# Patient Record
Sex: Female | Born: 1990 | Race: Black or African American | Hispanic: No | Marital: Single | Smoking: Never smoker
Health system: Southern US, Community
[De-identification: ages and names within clinical notes are randomized; demographics above are authoritative.]

## PROBLEM LIST (undated history)

## (undated) DIAGNOSIS — J302 Other seasonal allergic rhinitis: Secondary | ICD-10-CM

## (undated) DIAGNOSIS — Z8619 Personal history of other infectious and parasitic diseases: Secondary | ICD-10-CM

## (undated) DIAGNOSIS — R109 Unspecified abdominal pain: Secondary | ICD-10-CM

## (undated) DIAGNOSIS — A6000 Herpesviral infection of urogenital system, unspecified: Secondary | ICD-10-CM

## (undated) DIAGNOSIS — D649 Anemia, unspecified: Secondary | ICD-10-CM

## (undated) HISTORY — DX: Other seasonal allergic rhinitis: J30.2

## (undated) HISTORY — DX: Unspecified abdominal pain: R10.9

## (undated) HISTORY — DX: Anemia, unspecified: D64.9

## (undated) HISTORY — DX: Personal history of other infectious and parasitic diseases: Z86.19

## (undated) HISTORY — DX: Herpesviral infection of urogenital system, unspecified: A60.00

---

## 2012-11-02 DIAGNOSIS — A6000 Herpesviral infection of urogenital system, unspecified: Secondary | ICD-10-CM

## 2012-11-02 DIAGNOSIS — R109 Unspecified abdominal pain: Secondary | ICD-10-CM

## 2012-11-02 HISTORY — DX: Herpesviral infection of urogenital system, unspecified: A60.00

## 2012-11-02 HISTORY — DX: Unspecified abdominal pain: R10.9

## 2012-11-21 ENCOUNTER — Ambulatory Visit: Payer: 59

## 2012-11-21 ENCOUNTER — Ambulatory Visit (INDEPENDENT_AMBULATORY_CARE_PROVIDER_SITE_OTHER): Payer: 59 | Admitting: Family Medicine

## 2012-11-21 VITALS — BP 112/72 | HR 110 | Temp 98.9°F | Resp 17 | Ht 63.0 in | Wt 156.0 lb

## 2012-11-21 DIAGNOSIS — R0781 Pleurodynia: Secondary | ICD-10-CM

## 2012-11-21 DIAGNOSIS — R071 Chest pain on breathing: Secondary | ICD-10-CM

## 2012-11-21 DIAGNOSIS — Z113 Encounter for screening for infections with a predominantly sexual mode of transmission: Secondary | ICD-10-CM

## 2012-11-21 DIAGNOSIS — N920 Excessive and frequent menstruation with regular cycle: Secondary | ICD-10-CM

## 2012-11-21 LAB — POCT CBC
Granulocyte percent: 81.2 % — AB (ref 37–80)
HCT, POC: 37.7 % (ref 37.7–47.9)
Hemoglobin: 11.6 g/dL — AB (ref 12.2–16.2)
Lymph, poc: 1.3 (ref 0.6–3.4)
MCH, POC: 22 pg — AB (ref 27–31.2)
MCHC: 30.8 g/dL — AB (ref 31.8–35.4)
MCV: 71.5 fL — AB (ref 80–97)
MID (cbc): 0.5 (ref 0–0.9)
MPV: 11.8 fL (ref 0–99.8)
POC Granulocyte: 7.6 — AB (ref 2–6.9)
POC LYMPH PERCENT: 14 % (ref 10–50)
POC MID %: 4.8 % (ref 0–12)
Platelet Count, POC: 264 10*3/uL (ref 142–424)
RBC: 5.27 M/uL (ref 4.04–5.48)
RDW, POC: 15.1 %
WBC: 9.4 10*3/uL (ref 4.6–10.2)

## 2012-11-21 LAB — RPR

## 2012-11-21 MED ORDER — IBUPROFEN 800 MG PO TABS: 800.0000 mg | ORAL_TABLET | Freq: Three times a day (TID) | ORAL | Status: DC | PRN

## 2012-11-21 NOTE — Progress Notes (Signed)
[facility]:  Office Visit  Chief Complaint:  Chief Complaint  Patient presents with  . Chest Pain    with tightness in chest   . Vaginal Bleeding    patient states it is a lot more then usual     HPI: Alyssa Galvan is a 22 y.o. female who complains of: 1. Right sided Chest tightness x 2 days and also left lower rib pain x 2 days. Tight pain with inhalation also walking. Denies clamminess, numbness,tingling, SOB. Could not get out of bed. She has the right lower rib pain when she is running on treadmill, it is intermittent.   2. Wants STD screening. No prior STD testing. Sexually active. Does not use condom. She had a painful lulcer several weeks ago on her vagina.  3. LMP 11/15/12. Menarche. Flow is normal. Lasts 6-7 days. She has had irregular cycles. Does get clots. She states that today her cycle is heavy and longer than normal and wants to know why that is   History reviewed. No pertinent past medical history. History reviewed. No pertinent past surgical history. History   Social History  . Marital Status: Single    Spouse Name: N/A    Number of Children: N/A  . Years of Education: N/A   Social History Main Topics  . Smoking status: Never Smoker   . Smokeless tobacco: Never Used  . Alcohol Use: No  . Drug Use: No  . Sexually Active: Yes   Other Topics Concern  . None   Social History Narrative  . None   Family History  Problem Relation Age of Onset  . Cancer Paternal Grandmother   . Heart disease Paternal Grandfather    No Known Allergies Prior to Admission medications   Medication Sig Start Date End Date Taking? Authorizing Provider  Dentifrices (BIOTENE DRY MOUTH CARE DT) Place onto teeth.   Yes Historical Provider, MD     ROS: The patient denies chills, night sweats, unintentional weight loss, chest pain, palpitations, wheezing, dyspnea on exertion, nausea, vomiting, abdominal pain, dysuria, hematuria, melena, numbness, weakness, or  tingling.  All other systems have been reviewed and were otherwise negative with the exception of those mentioned in the HPI and as above.    PHYSICAL EXAM: Filed Vitals:   11/21/12 1052  BP: 112/72  Pulse: 110  Temp: 98.9 F (37.2 C)  Resp: 17   Filed Vitals:   11/21/12 1052  Height: 5\' 3"  (1.6 m)  Weight: 156 lb (70.761 kg)   Body mass index is 27.63 kg/(m^2).  General: Alert, no acute distress, slightly anxious HEENT:  Normocephalic, atraumatic, oropharynx patent. EOMI, PERRLa, fundosopic exam gorssly nl Cardiovascular:  Regular rate and rhythm, no rubs murmurs or gallops.  No Carotid bruits, radial pulse intact. No pedal edema.  Respiratory: Clear to auscultation bilaterally.  No wheezes, rales, or rhonchi.  No cyanosis, no use of accessory musculature GI: No organomegaly, abdomen is soft and non-tender, positive bowel sounds.  No masses. Skin: No rashes. Neurologic: Facial musculature symmetric. Anxious repeating questions over again, asking me if she has bladder cancer since her abdomen feels full? Psychiatric: Patient is appropriate throughout our interaction. Lymphatic: No cervical lymphadenopathy Musculoskeletal: Gait intact. GU-no cervical abnormalities. + menses. No masses,lesions, rashes   LABS: No results found for this or any previous visit.   EKG/XRAY:   Primary read interpreted by Dr. Conley Rolls at [facility]. No acute cardiopulmoanry process   ASSESSMENT/PLAN: Encounter Diagnoses  Name Primary?  . Pleuritic  chest pain Yes  . Screening for STD (sexually transmitted disease)   . Heavy menses    Rib and Chest pain secondary to cough and inflammation-chest xray negative Monitor menses to see if cycles is prolonged and heavier than normal, I would expect some of this happens on occasion since her cycles are not consistently regular, they range from 2 weeks-330 days.  ST labs pending Follow-up prn Will get labs to her in the next 2-3 days Ibuprofen 800 mg q8hrs prn,  take with food for rib pain.     LE, THAO PHUONG, DO 11/21/2012 12:12 PM

## 2012-11-22 ENCOUNTER — Other Ambulatory Visit: Payer: Self-pay | Admitting: Family Medicine

## 2012-11-22 ENCOUNTER — Telehealth: Payer: Self-pay

## 2012-11-22 DIAGNOSIS — B009 Herpesviral infection, unspecified: Secondary | ICD-10-CM

## 2012-11-22 DIAGNOSIS — A749 Chlamydial infection, unspecified: Secondary | ICD-10-CM

## 2012-11-22 LAB — GC/CHLAMYDIA PROBE AMP
CT Probe RNA: POSITIVE — AB
GC Probe RNA: NEGATIVE

## 2012-11-22 LAB — HSV(HERPES SIMPLEX VRS) I + II AB-IGG
HSV 1 Glycoprotein G Ab, IgG: <0.1 IV
HSV 2 Glycoprotein G Ab, IgG: 16.25 IV — ABNORMAL HIGH

## 2012-11-22 LAB — HIV ANTIBODY (ROUTINE TESTING W REFLEX): HIV: NONREACTIVE

## 2012-11-22 LAB — HEPATITIS B SURFACE ANTIBODY, QUANTITATIVE: Hep B S AB Quant (Post): 1.2 m[IU]/mL

## 2012-11-22 LAB — HEPATITIS B SURFACE ANTIGEN: Hepatitis B Surface Ag: NEGATIVE

## 2012-11-22 LAB — HEPATITIS C ANTIBODY: HCV Ab: NEGATIVE

## 2012-11-22 MED ORDER — AZITHROMYCIN 250 MG PO TABS: ORAL_TABLET | ORAL | Status: DC

## 2012-11-22 MED ORDER — VALACYCLOVIR HCL 1 G PO TABS: 1000.0000 mg | ORAL_TABLET | Freq: Two times a day (BID) | ORAL | Status: DC

## 2012-11-22 NOTE — Telephone Encounter (Signed)
Left copy of labs up front for pt to p/u

## 2012-11-22 NOTE — Progress Notes (Signed)
Spoke to patient regarding labs. She will come pick them up. 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

## 2012-11-22 NOTE — Telephone Encounter (Signed)
Message copied by Johnnette Litter on Tue Nov 22, 2012  2:59 PM ------      Message from: Lenell Antu      Created: Tue Nov 22, 2012  1:39 PM       Forgot to give you patient name            Tle

## 2012-11-24 ENCOUNTER — Encounter (HOSPITAL_COMMUNITY): Payer: Self-pay | Admitting: Emergency Medicine

## 2012-11-24 ENCOUNTER — Emergency Department (HOSPITAL_COMMUNITY)
Admission: EM | Admit: 2012-11-24 | Discharge: 2012-11-24 | Disposition: A | Discharge status: Home / Self Care | Payer: 59 | Attending: Emergency Medicine | Admitting: Emergency Medicine

## 2012-11-24 DIAGNOSIS — Z79899 Other long term (current) drug therapy: Secondary | ICD-10-CM | POA: Insufficient documentation

## 2012-11-24 DIAGNOSIS — Z3202 Encounter for pregnancy test, result negative: Secondary | ICD-10-CM | POA: Insufficient documentation

## 2012-11-24 DIAGNOSIS — R109 Unspecified abdominal pain: Secondary | ICD-10-CM | POA: Insufficient documentation

## 2012-11-24 LAB — URINALYSIS, ROUTINE W REFLEX MICROSCOPIC
Glucose, UA: NEGATIVE mg/dL
Hgb urine dipstick: NEGATIVE
Protein, ur: NEGATIVE mg/dL
Specific Gravity, Urine: 1.027 (ref 1.005–1.030)

## 2012-11-24 LAB — COMPREHENSIVE METABOLIC PANEL
ALT: 32 U/L (ref 0–35)
AST: 24 U/L (ref 0–37)
CO2: 25 mEq/L (ref 19–32)
Calcium: 8.8 mg/dL (ref 8.4–10.5)
GFR calc non Af Amer: >90 mL/min (ref >90)
Potassium: 3.9 mEq/L (ref 3.5–5.1)
Sodium: 140 mEq/L (ref 135–145)

## 2012-11-24 LAB — CBC
MCH: 22.3 pg — ABNORMAL LOW (ref 26.0–34.0)
Platelets: 237 10*3/uL (ref 150–400)
RBC: 4.43 MIL/uL (ref 3.87–5.11)
WBC: 8.8 10*3/uL (ref 4.0–10.5)

## 2012-11-24 LAB — PREGNANCY, URINE: Preg Test, Ur: NEGATIVE

## 2012-11-24 MED ORDER — GI COCKTAIL ~~LOC~~
30.0000 mL | Freq: Once | ORAL | Status: AC
  Administered 2012-11-24: 30 mL via ORAL
  Filled 2012-11-24: qty 30

## 2012-11-24 MED ORDER — RANITIDINE HCL 150 MG PO CAPS: 150.0000 mg | ORAL_CAPSULE | Freq: Every day | ORAL | Status: DC

## 2012-11-24 MED ORDER — RANITIDINE HCL 150 MG PO TABS: 150.0000 mg | ORAL_TABLET | Freq: Two times a day (BID) | ORAL | Status: DC

## 2012-11-24 NOTE — ED Provider Notes (Signed)
History     CSN: [id number]  Arrival date & time 11/24/12  4098   First MD Initiated Contact with Patient 11/24/12 [phone]      Chief Complaint  Patient presents with  . Abdominal Pain    (Consider location/radiation/quality/duration/timing/severity/associated sxs/prior treatment) HPI Pt presents with c/o epigastric and RUQ pain.  Pt states pain has been almost constant over the past week.  No vomiting, no fever/chills.  Pain is sharp.  No association with eating.  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  Pt states at that time the pain was in left upper abdomen, now it is more central and to the right side.  There are no other associated systemic symptoms, there are no other alleviating or modifying factors.   History reviewed. No pertinent past medical history.  History reviewed. No pertinent past surgical history.  Family History  Problem Relation Age of Onset  . Cancer Paternal Grandmother   . Heart disease Paternal Grandfather     History  Substance Use Topics  . Smoking status: Never Smoker   . Smokeless tobacco: Never Used  . Alcohol Use: No    OB History    Grav Para Term Preterm Abortions TAB SAB Ect Mult Living                  Review of Systems ROS reviewed and all otherwise negative except for mentioned in HPI  Allergies  Review of patient's allergies indicates no known allergies.  Home Medications   Current Outpatient Rx  Name  Route  Sig  Dispense  Refill  . AZITHROMYCIN 250 MG PO TABS      Take all 4 pills PO now         . BIOTENE DRY MOUTH CARE DT   dental   Place 1 application onto teeth daily.          . IBUPROFEN 800 MG PO TABS   Oral   Take 800 mg by mouth every 8 (eight) hours as needed. For pain         . VALACYCLOVIR HCL 1 G PO TABS   Oral   Take 1,000 mg by mouth 2 (two) times daily.         Marland Kitchen RANITIDINE HCL 150 MG PO TABS   Oral   Take 1 tablet (150 mg total) by  mouth 2 (two) times daily.   60 tablet   0     BP 114/64  Pulse 84  Temp 98.6 F (37 C) (Oral)  Resp 16  Ht 5\' 3"  (1.6 m)  Wt 153 lb (69.4 kg)  BMI 27.10 kg/m2  SpO2 98%  LMP 11/21/2012 Vitals reviewed Physical Exam Physical Examination: General appearance - alert, well appearing, and in no distress Mental status - alert, oriented to person, place, and time Eyes- no scleral icterus, no conjunctival injection Mouth - mucous membranes moist, pharynx normal without lesions Chest - clear to auscultation, no wheezes, rales or rhonchi, symmetric air entry Heart - normal rate, regular rhythm, normal S1, S2, no murmurs, rubs, clicks or gallops Abdomen - soft, epigastric and RUQ ttp, no gaurding or rebound, nondistended, no masses or organomegaly, nabs Extremities - peripheral pulses normal, no pedal edema, no clubbing or cyanosis Skin - normal coloration and turgor, no rashes  ED Course  Procedures (including critical care time)  Labs Reviewed  CBC - Abnormal; Notable for the following:    Hemoglobin 9.9 (*)  HCT 29.5 (*)     MCV 66.6 (*)     MCH 22.3 (*)     All other components within normal limits  COMPREHENSIVE METABOLIC PANEL - Abnormal; Notable for the following:    Albumin 3.3 (*)     Total Bilirubin 0.2 (*)     All other components within normal limits  LIPASE, BLOOD - Abnormal; Notable for the following:    Lipase 8 (*)     All other components within normal limits  URINALYSIS, ROUTINE W REFLEX MICROSCOPIC  PREGNANCY, URINE   No results found.   1. Abdominal pain       MDM  Pt presenting with c/o epigastric and right upper quadrant pain.  Labs reassuring.  Pt is overall nontoxic and well hydrated in appearance.  Mild ttp in epigastric and ruq.  Advised patient and mother at the bedside that next step if pain continues would be outpatient ultrasound.  Will give trial of zantac.  Discharged with strict return precautions.  Pt agreeable with  plan.        Ethelda Chick, MD 11/24/12 1039

## 2012-11-24 NOTE — ED Notes (Signed)
Pt states that over the last week she has been having epigastric pain, denies n/v or diarrhea.  States the pain is sharp in nature.  States that nothing has relieved the pain.  Denies fever or chills.  States that she was seen 11/22/2012 at a Urgent care and was prescribed Motrin but she has not had it filled yet.

## 2012-12-08 ENCOUNTER — Emergency Department (HOSPITAL_COMMUNITY)
Admission: EM | Admit: 2012-12-08 | Discharge: 2012-12-08 | Disposition: A | Discharge status: Home / Self Care | Payer: 59 | Attending: Emergency Medicine | Admitting: Emergency Medicine

## 2012-12-08 ENCOUNTER — Encounter (HOSPITAL_COMMUNITY): Payer: Self-pay | Admitting: Adult Health

## 2012-12-08 ENCOUNTER — Emergency Department (HOSPITAL_COMMUNITY): Payer: 59

## 2012-12-08 DIAGNOSIS — R109 Unspecified abdominal pain: Secondary | ICD-10-CM

## 2012-12-08 DIAGNOSIS — R112 Nausea with vomiting, unspecified: Secondary | ICD-10-CM | POA: Insufficient documentation

## 2012-12-08 DIAGNOSIS — Z3202 Encounter for pregnancy test, result negative: Secondary | ICD-10-CM | POA: Insufficient documentation

## 2012-12-08 DIAGNOSIS — R1031 Right lower quadrant pain: Secondary | ICD-10-CM

## 2012-12-08 DIAGNOSIS — Z8719 Personal history of other diseases of the digestive system: Secondary | ICD-10-CM | POA: Insufficient documentation

## 2012-12-08 DIAGNOSIS — K5900 Constipation, unspecified: Secondary | ICD-10-CM | POA: Insufficient documentation

## 2012-12-08 LAB — CBC WITH DIFFERENTIAL/PLATELET
Basophils Absolute: 0 10*3/uL (ref 0.0–0.1)
Lymphs Abs: 2 10*3/uL (ref 0.7–4.0)
MCH: 22.3 pg — ABNORMAL LOW (ref 26.0–34.0)
MCV: 66.8 fL — ABNORMAL LOW (ref 78.0–100.0)
Monocytes Absolute: 0.5 10*3/uL (ref 0.1–1.0)
Platelets: 319 10*3/uL (ref 150–400)
RDW: 14.8 % (ref 11.5–15.5)

## 2012-12-08 LAB — COMPREHENSIVE METABOLIC PANEL
AST: 21 U/L (ref 0–37)
Albumin: 4 g/dL (ref 3.5–5.2)
BUN: 10 mg/dL (ref 6–23)
CO2: 25 mEq/L (ref 19–32)
Calcium: 9.4 mg/dL (ref 8.4–10.5)
Creatinine, Ser: 0.84 mg/dL (ref 0.50–1.10)
GFR calc non Af Amer: >90 mL/min (ref >90)

## 2012-12-08 LAB — URINALYSIS, MICROSCOPIC ONLY
Leukocytes, UA: NEGATIVE
Nitrite: NEGATIVE
Specific Gravity, Urine: 1.028 (ref 1.005–1.030)
pH: 5 (ref 5.0–8.0)

## 2012-12-08 LAB — LIPASE, BLOOD: Lipase: 12 U/L (ref 11–59)

## 2012-12-08 LAB — POCT PREGNANCY, URINE: Preg Test, Ur: NEGATIVE

## 2012-12-08 MED ORDER — ONDANSETRON 4 MG PO TBDP
4.0000 mg | ORAL_TABLET | Freq: Once | ORAL | Status: AC
  Administered 2012-12-08: 4 mg via ORAL

## 2012-12-08 MED ORDER — POLYETHYLENE GLYCOL 3350 17 GM/SCOOP PO POWD: 17.0000 g | Freq: Two times a day (BID) | ORAL | Status: DC

## 2012-12-08 MED ORDER — IOHEXOL 300 MG/ML  SOLN
100.0000 mL | Freq: Once | INTRAMUSCULAR | Status: AC | PRN
  Administered 2012-12-08: 100 mL via INTRAVENOUS

## 2012-12-08 MED ORDER — DOCUSATE SODIUM 100 MG PO CAPS: 100.0000 mg | ORAL_CAPSULE | Freq: Two times a day (BID) | ORAL | Status: DC

## 2012-12-08 MED ORDER — ONDANSETRON 4 MG PO TBDP
8.0000 mg | ORAL_TABLET | Freq: Once | ORAL | Status: AC
  Administered 2012-12-08: 8 mg via ORAL
  Filled 2012-12-08: qty 2

## 2012-12-08 MED ORDER — ONDANSETRON 4 MG PO TBDP
ORAL_TABLET | ORAL | Status: AC
  Filled 2012-12-08: qty 2

## 2012-12-08 MED ORDER — IOHEXOL 300 MG/ML  SOLN
50.0000 mL | Freq: Once | INTRAMUSCULAR | Status: AC | PRN
  Administered 2012-12-08: 50 mL via ORAL

## 2012-12-08 NOTE — ED Notes (Signed)
Pt vomited x1, pain and nausea less after this

## 2012-12-08 NOTE — ED Notes (Signed)
Pt dc to home.  Pt states understanding to dc paperwork.  Pt ambulatory to exit.  Pt denies need for w/c.

## 2012-12-08 NOTE — ED Provider Notes (Signed)
History     CSN: [id number]  Arrival date & time 12/08/12  0052   First MD Initiated Contact with Patient 12/08/12 0158      Chief Complaint  Patient presents with  . Abdominal Pain    (Consider location/radiation/quality/duration/timing/severity/associated sxs/prior treatment) HPI Lauryl Bouyer is a 22 y.o. female who presents with generalized abdominal pain. This is localized to periumbilical region, she says it started this evening and is worsened over the course the last few hours. Pain started and then she's also had some nausea. She has not vomited prior to the interview, during medical interview patient vomits. She says the pain does radiate to her back she denies any diarrhea denies any hematochezia or melena. Last period was January. Patient was seen January 23 for similar periumbilical pain patient had a negative workup at that time. Patient denies any fevers but did have some chills earlier today. She has no vaginal discharge, no dysuria, no frequency. No flank pain.  History reviewed. No pertinent past medical history.  History reviewed. No pertinent past surgical history.  Family History  Problem Relation Age of Onset  . Cancer Paternal Grandmother   . Heart disease Paternal Grandfather     History  Substance Use Topics  . Smoking status: Never Smoker   . Smokeless tobacco: Never Used  . Alcohol Use: No    OB History    Grav Para Term Preterm Abortions TAB SAB Ect Mult Living                  Review of Systems At least 10pt or greater review of systems completed and are negative except where specified in the HPI.  Allergies  Review of patient's allergies indicates no known allergies.  Home Medications   Current Outpatient Rx  Name  Route  Sig  Dispense  Refill  . BIOTIN PO   Oral   Take 1 tablet by mouth daily.           BP 127/74  Pulse 73  Temp 97.6 F (36.4 C) (Oral)  Resp 18  SpO2 100%  LMP 11/21/2012  Physical Exam  Nursing notes  reviewed.  Electronic medical record reviewed. VITAL SIGNS:   Filed Vitals:   12/08/12 0056  BP: 127/74  Pulse: 73  Temp: 97.6 F (36.4 C)  TempSrc: Oral  Resp: 18  SpO2: 100%   CONSTITUTIONAL: Awake, oriented, appears non-toxic HENT: Atraumatic, normocephalic, oral mucosa pink and moist, airway patent. Nares patent without drainage. External ears normal. EYES: Conjunctiva clear, EOMI, PERRLA NECK: Trachea midline, non-tender, supple CARDIOVASCULAR: Normal heart rate, Normal rhythm, No murmurs, rubs, gallops PULMONARY/CHEST: Clear to auscultation, no rhonchi, wheezes, or rales. Symmetrical breath sounds. Non-tender. ABDOMINAL: Non-distended, soft, tenderness to palpation in right lower quadrant that is worse than periumbilical palpation.  BS normal. NEUROLOGIC: Non-focal, moving all four extremities, no gross sensory or motor deficits. EXTREMITIES: No clubbing, cyanosis, or edema SKIN: Warm, Dry, No erythema, No rash  ED Course  Procedures (including critical care time)  Labs Reviewed  URINALYSIS, MICROSCOPIC ONLY - Abnormal; Notable for the following:    APPearance CLOUDY (*)     All other components within normal limits  CBC WITH DIFFERENTIAL - Abnormal; Notable for the following:    Hemoglobin 11.0 (*)     HCT 33.0 (*)     MCV 66.8 (*)     MCH 22.3 (*)     All other components within normal limits  COMPREHENSIVE METABOLIC PANEL - Abnormal; Notable for  the following:    Glucose, Bld 104 (*)     Total Bilirubin 0.2 (*)     All other components within normal limits  LIPASE, BLOOD  POCT PREGNANCY, URINE   Ct Abdomen Pelvis W Contrast  12/08/2012  *RADIOLOGY REPORT*  Clinical Data: Right lower quadrant pain for 1 week.  Vomiting.  CT ABDOMEN AND PELVIS WITH CONTRAST  Technique:  Multidetector CT imaging of the abdomen and pelvis was performed following the standard protocol during bolus administration of intravenous contrast.  Contrast: OMNIPAQUE IOHEXOL 300 MG/ML  SOLN   Comparison: None.  Findings: The lung bases are clear.  The liver, spleen, gallbladder, pancreas, adrenal glands, kidneys, abdominal aorta, and retroperitoneal lymph nodes are unremarkable. Stool filled colon without significant distension.  Stomach and small bowel are decompressed.  No free fluid or free air in the abdomen.  Pelvis:  The appendix is visualized and appears normal with gash to the tip.  The terminal ileum is mildly distended without wall thickening, probably normal variation.  Diffusely stool filled colon may reflect constipation.  No evidence of diverticulitis. The uterus and ovaries are not enlarged.  No free pelvic fluid collections.  No loculated fluid collections.  No bladder wall thickening.  No significant pelvic lymphadenopathy.  Normal alignment of the lumbar vertebrae.  IMPRESSION: Normal appendix.  Stool filled colon suggesting constipation.  No definite acute inflammatory changes in the abdomen or pelvis.   Original Report Authenticated By: Burman Nieves, M.D.      1. Abdominal  pain, other specified site   2. Right lower quadrant pain   3. Nausea and vomiting   4. Constipation      Medications  BIOTIN PO (not administered)  polyethylene glycol powder (GLYCOLAX) powder (not administered)  docusate sodium (COLACE) 100 MG capsule (not administered)  ondansetron (ZOFRAN-ODT) disintegrating tablet 8 mg (8 mg Oral Given 12/08/12 0112)  ondansetron (ZOFRAN-ODT) disintegrating tablet 4 mg (4 mg Oral Given 12/08/12 0214)  iohexol (OMNIPAQUE) 300 MG/ML solution 50 mL (50 mL Oral Contrast Given 12/08/12 0240)  iohexol (OMNIPAQUE) 300 MG/ML solution 100 mL (100 mL Intravenous Contrast Given 12/08/12 0338)     MDM  Asusena Wagler is a 22 y.o. female presents with abdominal pain nausea and vomiting. No diarrhea. Do not think this is acute gastroenteritis-patient was prescribed acid reducing medicine but sauces she had GERD previously, her presentation currently is not consistent with  GERD-I. most concerned for appendicitis at this time.  Discussed risks and benefits of abdominal and pelvis CT with patient she understands accepts the risks.  Labs are fairly unremarkable this time, not going to rely on WBC count as it's very nonpecific/insensitive.  CT of the abdomen and pelvis shows a normal appendix is visualized well, radiologist makes comment that there is a stool filled colon suggesting constipation, there is no definite acute inflammatory changes in the abdomen or pelvis.  Putting patient on bowel regimen to include Colace and GlycoLax.  I explained the diagnosis and have given explicit precautions to return to the ER including any other new or worsening symptoms. The patient understands and accepts the medical plan as it's been dictated and I have answered their questions. Discharge instructions concerning home care and prescriptions have been given.  The patient is STABLE and is discharged to home in good condition.          Jones Skene, MD 12/08/12 0830

## 2012-12-08 NOTE — ED Notes (Signed)
Presents with 2 hours of abdominal pain that hurts all over and has increased in intensity. Pain is described as intermittent and "fluid like" pain radiates to back. Associated with nausea, denies diarrhea. LMP: in January. Nothing makes pain better, lying down makes pain worse.

## 2012-12-08 NOTE — ED Notes (Signed)
Pt to ct via stretcher

## 2012-12-21 ENCOUNTER — Ambulatory Visit: Payer: 59 | Admitting: Medical

## 2012-12-23 ENCOUNTER — Ambulatory Visit: Payer: 59 | Admitting: Medical

## 2013-03-02 DIAGNOSIS — Z8619 Personal history of other infectious and parasitic diseases: Secondary | ICD-10-CM

## 2013-03-02 HISTORY — DX: Personal history of other infectious and parasitic diseases: Z86.19

## 2013-03-23 ENCOUNTER — Ambulatory Visit (INDEPENDENT_AMBULATORY_CARE_PROVIDER_SITE_OTHER): Payer: 59 | Admitting: Family Medicine

## 2013-03-23 VITALS — BP 102/62 | HR 71 | Temp 98.9°F | Resp 16 | Ht 63.0 in | Wt 148.0 lb

## 2013-03-23 DIAGNOSIS — J020 Streptococcal pharyngitis: Secondary | ICD-10-CM

## 2013-03-23 DIAGNOSIS — R21 Rash and other nonspecific skin eruption: Secondary | ICD-10-CM

## 2013-03-23 DIAGNOSIS — J029 Acute pharyngitis, unspecified: Secondary | ICD-10-CM

## 2013-03-23 DIAGNOSIS — Z7251 High risk heterosexual behavior: Secondary | ICD-10-CM

## 2013-03-23 DIAGNOSIS — K0510 Chronic gingivitis, plaque induced: Secondary | ICD-10-CM

## 2013-03-23 LAB — POCT CBC
Granulocyte percent: 69.9 %G (ref 37–80)
HCT, POC: 37.9 % (ref 37.7–47.9)
Hemoglobin: 11.9 g/dL — AB (ref 12.2–16.2)
Lymph, poc: 1.6 (ref 0.6–3.4)
MCH, POC: 22.5 pg — AB (ref 27–31.2)
MCHC: 31.4 g/dL — AB (ref 31.8–35.4)
MCV: 71.5 fL — AB (ref 80–97)
MID (cbc): 0.4 (ref 0–0.9)
MPV: 11.5 fL (ref 0–99.8)
POC Granulocyte: 4.8 (ref 2–6.9)
POC LYMPH PERCENT: 24 %L (ref 10–50)
POC MID %: 6.1 %M (ref 0–12)
Platelet Count, POC: 215 10*3/uL (ref 142–424)
RBC: 5.3 M/uL (ref 4.04–5.48)
RDW, POC: 15.4 %
WBC: 6.8 10*3/uL (ref 4.6–10.2)

## 2013-03-23 LAB — POCT RAPID STREP A (OFFICE): Rapid Strep A Screen: NEGATIVE

## 2013-03-23 MED ORDER — PREDNISONE 20 MG PO TABS: ORAL_TABLET | ORAL | Status: DC

## 2013-03-23 MED ORDER — PENICILLIN V POTASSIUM 500 MG PO TABS: 500.0000 mg | ORAL_TABLET | Freq: Three times a day (TID) | ORAL | Status: DC

## 2013-03-23 MED ORDER — IVERMECTIN 3 MG PO TABS: 3.0000 mg | ORAL_TABLET | Freq: Once | ORAL | Status: DC

## 2013-03-23 NOTE — Patient Instructions (Addendum)
Please start one over the counter iron tablet daily and return in a month to recheck the blood count because you have a mild anemia Other tests are pending.  Strep is negative.  Gingivitis Gingivitis is a form of gum (periodontal) disease that causes redness, soreness, and swelling (inflammation) of your gums. CAUSES The most common cause of gingivitis is poor oral hygiene. A sticky substance made of bacteria, mucus, and food particles (plaque), is deposited on the exposed part of teeth. As plaque builds up, it reacts with the saliva in your mouth to form something called  tartar. Tartar is a hard deposit that becomes trapped around the base of the tooth. Plaque and tartar irritate the gums, leading to the formation of gingivitis. Other factors that increase your risk for gingivitis include:   Tobacco use.  Diabetes.  Older age.  Certain medications.  Certain viral or fungal infections.  Dry mouth.  Hormonal changes such as during pregnancy.  Poor nutrition.  Substance abuse.  Poor fitting dental restorations or appliances. SYMPTOMS You may notice inflammation of the soft tissue (gingiva) around the teeth. When these tissues become inflamed, they bleed easily, especially during flossing or brushing. The gums may also be:   Tender to the touch.  Bright red, purple red, or have a shiny appearance.  Swollen.  Wearing away from the teeth (receding), which exposes more of the tooth. Bad breath is often present. Continued infection around teeth can eventually cause cavities and loosen teeth. This may lead to eventual tooth loss. DIAGNOSIS A medical and dental history will be taken. Your mouth, teeth, and gums will be examined. Your dentist will look for soft, swollen purple-red, irritated gums. There may be deposits of plaque and tartar at the base of the teeth. Your gums will be looked at for the degree of redness, puffiness, and bleeding tendencies. Your dentist will see if any of  the teeth are loose. X-rays may be taken to see if the inflammation has spread to the supporting structures of the teeth. TREATMENT The goal is to reduce and reverse the inflammation. Proper treatment can usually reverse the symptoms of gingivitis and prevent further progression of the disease. Have your teeth cleaned. During the cleaning, all plaque and tartar will be removed. Instruction for proper home care will be given. You will need regular professional cleanings and check-ups in the future. HOME CARE INSTRUCTIONS  Brush your teeth twice a day and floss at least once per day. When flossing, it is best to floss first then brush.  Limit sugar between meals and maintain a well-balanced diet.  Even the best dental hygiene will not prevent plaque from developing. It is necessary for you to see your dentist on a regular basis for cleaning and regular checkups.  Your dentist can recommend proper oral hygiene and mouth care and suggest special toothpastes or mouth rinses.  Stop smoking. SEEK DENTAL OR MEDICAL CARE IF:  You have painful, reddened tissue around your teeth, or you have puffy swollen gums.  You have difficulty chewing.  You notice any loose or infected teeth.  You have swollen glands.  Your gums bleed easily when you brush your teeth or are very tender to the touch. Document Released: 04/14/2001 Document Revised: 01/11/2012 Document Reviewed: 01/23/2011 [facility] Patient Information 2014 [place], [place].    Sexually Transmitted Disease Sexually transmitted disease (STD) refers to any infection that is passed from person to person during sexual activity. This may happen by way of saliva, semen, blood, vaginal  mucus, or urine. Common STDs include:  Gonorrhea.  Chlamydia.  Syphilis.  HIV/AIDS.  Genital herpes.  Hepatitis B and C.  Trichomonas.  Human papillomavirus (HPV).  Pubic lice. CAUSES  An STD may be spread by bacteria, virus, or parasite. A person can  get an STD by:  Sexual intercourse with an infected person.  Sharing sex toys with an infected person.  Sharing needles with an infected person.  Having intimate contact with the genitals, mouth, or rectal areas of an infected person. SYMPTOMS  Some people may not have any symptoms, but they can still pass the infection to others. Different STDs have different symptoms. Symptoms include:  Painful or bloody urination.  Pain in the pelvis, abdomen, vagina, anus, throat, or eyes.  Skin rash, itching, irritation, growths, or sores (lesions). These usually occur in the genital or anal area.  Abnormal vaginal discharge.  Penile discharge in men.  Soft, flesh-colored skin growths in the genital or anal area.  Fever.  Pain or bleeding during sexual intercourse.  Swollen glands in the groin area.  Yellow skin and eyes (jaundice). This is seen with hepatitis. DIAGNOSIS  To make a diagnosis, your caregiver may:  Take a medical history.  Perform a physical exam.  Take a specimen (culture) to be examined.  Examine a sample of discharge under a microscope.  Perform blood tests.  Perform a Pap test, if this applies.  Perform a colposcopy.  Perform a laparoscopy. TREATMENT   Chlamydia, gonorrhea, trichomonas, and syphilis can be cured with antibiotic medicine.  Genital herpes, hepatitis, and HIV can be treated, but not cured, with prescribed medicines. The medicines will lessen the symptoms.  Genital warts from HPV can be treated with medicine or by freezing, burning (electrocautery), or surgery. Warts may come back.  HPV is a virus and cannot be cured with medicine or surgery.However, abnormal areas may be followed very closely by your caregiver and may be removed from the cervix, vagina, or vulva through office procedures or surgery. If your diagnosis is confirmed, your recent sexual partners need treatment. This is true even if they are symptom-free or have a negative  culture or evaluation. They should not have sex until their caregiver says it is okay. HOME CARE INSTRUCTIONS  All sexual partners should be informed, tested, and treated for all STDs.  Take your antibiotics as directed. Finish them even if you start to feel better.  Only take over-the-counter or prescription medicines for pain, discomfort, or fever as directed by your caregiver.  Rest.  Eat a balanced diet and drink enough fluids to keep your urine clear or pale yellow.  Do not have sex until treatment is completed and you have followed up with your caregiver. STDs should be checked after treatment.  Keep all follow-up appointments, Pap tests, and blood tests as directed by your caregiver.  Only use latex condoms and water-soluble lubricants during sexual activity. Do not use petroleum jelly or oils.  Avoid alcohol and illegal drugs.  Get vaccinated for HPV and hepatitis. If you have not received these vaccines in the past, talk to your caregiver about whether one or both might be right for you.  Avoid risky sex practices that can break the skin. The only way to avoid getting an STD is to avoid all sexual activity.Latex condoms and dental dams (for oral sex) will help lessen the risk of getting an STD, but will not completely eliminate the risk. SEEK MEDICAL CARE IF:   You have a fever.  You have any new or worsening symptoms. Document Released: 01/09/2003 Document Revised: 01/11/2012 Document Reviewed: 01/16/2011 [facility] Patient Information 2014 [place], [place].

## 2013-03-23 NOTE — Progress Notes (Signed)
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  She also wants STD testing (h/o unprotected IC and h/o HSV 2), and has 1 day of sore throat Boyfriend is now itching.  She was diagnosed with scabies. LMP:  now  Objective:  NAD Scaling feet, no rash on hands Throat: red, withoutexudates Neck: supple, no adenopathy TM's:  Normal Results for orders placed in visit on 03/23/13  POCT RAPID STREP A (OFFICE)      Result Value Range   Rapid Strep A Screen Negative  Negative  POCT CBC      Result Value Range   WBC 6.8  4.6 - 10.2 K/uL   Lymph, poc 1.6  0.6 - 3.4   POC LYMPH PERCENT 24.0  10 - 50 %L   MID (cbc) 0.4  0 - 0.9   POC MID % 6.1  0 - 12 %M   POC Granulocyte 4.8  2 - 6.9   Granulocyte percent 69.9  37 - 80 %G   RBC 5.30  4.04 - 5.48 M/uL   Hemoglobin 11.9 (*) 12.2 - 16.2 g/dL   HCT, POC 16.1  09.6 - 47.9 %   MCV 71.5 (*) 80 - 97 fL   MCH, POC 22.5 (*) 27 - 31.2 pg   MCHC 31.4 (*) 31.8 - 35.4 g/dL   RDW, POC 04.5     Platelet Count, POC 215  142 - 424 K/uL   MPV 11.5  0 - 99.8 fL      Assessment:  Gingivitis, atypical rash is not characteristic of any other than eczema, and unprotected sex  Plan:  Recommend condoms Daily iron with follow up in a month Sore throat - Plan: POCT rapid strep A, Culture, Group A Strep, penicillin v potassium (VEETID) 500 MG tablet  Unprotected sex - Plan: RPR, HIV antibody, GC/chlamydia probe amp, urine  Rash and nonspecific skin eruption - Plan: POCT CBC, predniSONE (DELTASONE) 20 MG tablet, ivermectin (STROMECTOL) 3 MG TABS  Gingivitis - Plan: penicillin v potassium (VEETID) 500 MG tablet  Signed, Mosie Epstein, MD

## 2013-03-24 LAB — GC/CHLAMYDIA PROBE AMP, URINE
Chlamydia, Swab/Urine, PCR: POSITIVE — AB
GC Probe Amp, Urine: NEGATIVE

## 2013-03-24 LAB — HIV ANTIBODY (ROUTINE TESTING W REFLEX): HIV: NONREACTIVE

## 2013-03-24 LAB — RPR

## 2013-03-25 LAB — CULTURE, GROUP A STREP: Organism ID, Bacteria: NORMAL

## 2013-03-25 MED ORDER — AZITHROMYCIN 250 MG PO TABS: ORAL_TABLET | ORAL | Status: DC

## 2013-03-25 NOTE — Addendum Note (Signed)
Addended by: Johnnette Litter on: 03/25/2013 12:04 PM   Modules accepted: Orders

## 2013-04-19 ENCOUNTER — Ambulatory Visit (INDEPENDENT_AMBULATORY_CARE_PROVIDER_SITE_OTHER): Payer: 59 | Admitting: Medical

## 2013-04-19 ENCOUNTER — Telehealth: Payer: Self-pay

## 2013-04-19 ENCOUNTER — Encounter: Payer: Self-pay | Admitting: Medical

## 2013-04-19 VITALS — BP 100/60 | HR 68 | Temp 98.1°F | Resp 16 | Ht 62.0 in | Wt 152.0 lb

## 2013-04-19 DIAGNOSIS — K0510 Chronic gingivitis, plaque induced: Secondary | ICD-10-CM

## 2013-04-19 DIAGNOSIS — L309 Dermatitis, unspecified: Secondary | ICD-10-CM

## 2013-04-19 DIAGNOSIS — Z8619 Personal history of other infectious and parasitic diseases: Secondary | ICD-10-CM

## 2013-04-19 DIAGNOSIS — L259 Unspecified contact dermatitis, unspecified cause: Secondary | ICD-10-CM

## 2013-04-19 DIAGNOSIS — A6000 Herpesviral infection of urogenital system, unspecified: Secondary | ICD-10-CM

## 2013-04-19 DIAGNOSIS — Z0000 Encounter for general adult medical examination without abnormal findings: Secondary | ICD-10-CM

## 2013-04-19 MED ORDER — AZITHROMYCIN 500 MG PO TABS: 1000.0000 mg | ORAL_TABLET | Freq: Every day | ORAL | Status: DC

## 2013-04-19 NOTE — Progress Notes (Signed)
Subjective:   HPI  Alyssa Galvan is a 22 y.o. female who presents for a complete physical.  New patient today.  Was seeing urgent care for acute issues.   Preventative care: Last ophthalmology visit:n/a Last dental visit:n/a Last colonoscopy:n/a Last mammogram:n/a Last gynecological exam:2014 Last EKGn/a Last labs:03/2013  Prior vaccinations: TD or Tdap:unsure  Influenza:n/a Pneumococcal:n/a Shingles/Zostavax:n/a Other:   Advanced directive:n/a Health care power of attorney:n/a Living will:n/a  Concerns: Saw urgent care this year twice, once for new diagnosis of herpes, once for chlamydia, was treated for both, and has since broke up with that sexual partner.  No current vaginal symptoms.  Currently abstinent.    Has concerns about skin rash.  Was thought to have scabies few months back, was given permethrin.  Now has patch of rash on arms and nec, mild.   Reviewed their medical, surgical, family, social, medication, and allergy history and updated chart as appropriate.   Past Medical History  Diagnosis Date  . Seasonal allergic rhinitis   . Anemia     longstanding, mild per patient  . Genital herpes 2014  . History of chlamydia 03/2013  . Abdominal pain 2014    ED visit, etiology constipation    History reviewed. No pertinent past surgical history.  Family History  Problem Relation Age of Onset  . Cancer Paternal Grandmother   . Heart disease Paternal Grandfather     History   Social History  . Marital Status: Single    Spouse Name: N/A    Number of Children: N/A  . Years of Education: N/A   Occupational History  . Not on file.   Social History Main Topics  . Smoking status: Never Smoker   . Smokeless tobacco: Never Used  . Alcohol Use: No  . Drug Use: No  . Sexually Active: Not Currently    Birth Control/ Protection: None   Other Topics Concern  . 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   No current facility-administered medications on file prior to visit.    No Known Allergies   Review of Systems Constitutional: -fever, -chills, -sweats, -unexpected weight change, -decreased appetite, -fatigue Allergy: -sneezing, -itching, -congestion Dermatology: -changing moles, -+rash, -lumps ENT: -runny nose, -ear pain, -sore throat, -hoarseness, -sinus pain, -teeth pain, - ringing in ears, -hearing loss, -nosebleeds Cardiology: -chest pain, -palpitations, -swelling, -difficulty breathing when lying flat, -waking up short of breath Respiratory: -cough, -shortness of breath, -difficulty breathing with exercise or exertion, -wheezing, -coughing up blood Gastroenterology: -abdominal pain, -nausea, -vomiting, -diarrhea, -constipation, +blood in stool, -changes in bowel movement, -difficulty swallowing or eating Hematology: -bleeding, -bruising  Musculoskeletal: -joint aches, -muscle aches, -joint swelling, -back pain, -neck pain, -cramping, -changes in gait Ophthalmology: denies vision changes, eye redness, itching, discharge Urology: -burning with urination, -difficulty urinating, -blood in urine, -urinary frequency, -urgency, -incontinence Neurology: -headache, -weakness, -tingling, -numbness, -memory loss, -falls, -dizziness Psychology: -depressed mood, -agitation, -sleep problems     Objective:   Physical Exam  Filed Vitals:   04/19/13 1451  BP: 100/60  Pulse: 68  Temp: 98.1 F (36.7 C)  Resp: 16    General appearance: alert, no distress, WD/WN, AA female Skin: faint patch of rough skin left antecubital region, otherwise no rash HEENT: normocephalic, conjunctiva/corneas normal, sclerae anicteric, PERRLA, EOMi, nares patent, no discharge or erythema, pharynx normal Oral  cavity: MMM, tongue normal, teeth normal Neck: supple, no lymphadenopathy, no thyromegaly, no masses,  normal ROM Chest: non tender, normal shape and expansion Heart: RRR, normal S1, S2, no murmurs Lungs: CTA bilaterally, no wheezes, rhonchi, or rales Abdomen: +bs, soft, non tender, non distended, no masses, no hepatomegaly, no splenomegaly, no bruits Back: non tender, normal ROM, no scoliosis Musculoskeletal: upper extremities non tender, no obvious deformity, normal ROM throughout, lower extremities non tender, no obvious deformity, normal ROM throughout Extremities: no edema, no cyanosis, no clubbing Pulses: 2+ symmetric, upper and lower extremities, normal cap refill Neurological: alert, oriented x 3, CN2-12 intact, strength normal upper extremities and lower extremities, sensation normal throughout, DTRs 2+ throughout, no cerebellar signs, gait normal Psychiatric: normal affect, behavior normal, pleasant  Breast/gyn/rectal - deferred   Assessment and Plan :    Encounter Diagnoses  Name Primary?  . Routine general medical examination at a health care facility Yes  . Eczema   . Genital herpes   . History of chlamydia   . Gingivitis       Physical exam - discussed healthy lifestyle, diet, exercise, preventative care, vaccinations, and addressed their concerns.  Handout given.  discussed contraception.  She is practicing abstinence for now.  Eczema - discussed diagnosis, daily moisturizing lotion, preventative measures, OTC hydrocortisone for flare ups, and recheck if worsening.  Genital herpes - discussed transmission, prevention, acute vs suppressive therapy, condom use.  history of chlamydia, 03/2013.  discussed prevention, safe sex, and she will return in 60mo for repeat testing  Gingivitis - f/u with dentist, discussed flossing, brushing, hygiene  Follow-up 60mo

## 2013-04-19 NOTE — Telephone Encounter (Signed)
Is it okay to send in the Zithromax? Again?

## 2013-04-19 NOTE — Telephone Encounter (Signed)
Pt states that she tested positive for chlamydia in may and never picked up the rx that was precribed for it because she had to go out of town, pt would like to know if the medication prescribed could be called in again she does not know the name of the medication. Best# [phone]  Pharmacy:[facility]

## 2013-04-19 NOTE — Patient Instructions (Addendum)
Dr. Yancey Flemings, dentist [address] [place], [place] [address] [phone] [email or web address]  Return in 3 months for STD recheck.  Begin Hydrocortisone cream OTC for eczema. Use daily moisturizing lotion to your skin.   Eczema Atopic dermatitis, or eczema, is an inherited type of sensitive skin. Often people with eczema have a family history of allergies, asthma, or hay fever. It causes a red itchy rash and dry scaly skin. The itchiness may occur before the skin rash and may be very intense. It is not contagious. Eczema is generally worse during the cooler winter months and often improves with the warmth of summer. Eczema usually starts showing signs in infancy. Some children outgrow eczema, but it may last through adulthood. Flare-ups may be caused by:  Eating something or contact with something you are sensitive or allergic to.  Stress. DIAGNOSIS  The diagnosis of eczema is usually based upon symptoms and medical history. TREATMENT  Eczema cannot be cured, but symptoms usually can be controlled with treatment or avoidance of allergens (things to which you are sensitive or allergic to).  Controlling the itching and scratching.  Use over-the-counter antihistamines as directed for itching. It is especially useful at night when the itching tends to be worse.  Use over-the-counter steroid creams as directed for itching.  Scratching makes the rash and itching worse and may cause impetigo (a skin infection) if fingernails are contaminated (dirty).  Keeping the skin well moisturized with creams every day. This will seal in moisture and help prevent dryness. Lotions containing alcohol and water can dry the skin and are not recommended.  Limiting exposure to allergens.  Recognizing situations that cause stress.  Developing a plan to manage stress. HOME CARE INSTRUCTIONS   Take prescription and over-the-counter medicines as directed by your caregiver.  Do not use anything on the  skin without checking with your caregiver.  Keep baths or showers short (5 minutes) in warm (not hot) water. Use mild cleansers for bathing. You may add non-perfumed bath oil to the bath water. It is best to avoid soap and bubble bath.  Immediately after a bath or shower, when the skin is still damp, apply a moisturizing ointment to the entire body. This ointment should be a petroleum ointment. This will seal in moisture and help prevent dryness. The thicker the ointment the better. These should be unscented.  Keep fingernails cut short and wash hands often. If your child has eczema, it may be necessary to put soft gloves or mittens on your child at night.  Dress in clothes made of cotton or cotton blends. Dress lightly, as heat increases itching.  Avoid foods that may cause flare-ups. Common foods include cow's milk, peanut butter, eggs and wheat.  Keep a child with eczema away from anyone with fever blisters. The virus that causes fever blisters (herpes simplex) can cause a serious skin infection in children with eczema. SEEK MEDICAL CARE IF:   Itching interferes with sleep.  The rash gets worse or is not better within one week following treatment.  The rash looks infected (pus or soft yellow scabs).  You or your child has an oral temperature above 102 F (38.9 C).  Your baby is older than 3 months with a rectal temperature of 100.5 F (38.1 C) or higher for more than 1 day.  The rash flares up after contact with someone who has fever blisters. SEEK IMMEDIATE MEDICAL CARE IF:   Your baby is older than 3 months with a rectal temperature of  102 F (38.9 C) or higher.  Your baby is older than 3 months or younger with a rectal temperature of 100.4 F (38 C) or higher. Document Released: 10/16/2000 Document Revised: 01/11/2012 Document Reviewed: 08/21/2009 [facility] Patient Information 2014 [place], [place].

## 2013-04-19 NOTE — Telephone Encounter (Signed)
Sent to the pharmacy

## 2013-04-20 NOTE — Telephone Encounter (Signed)
Called her to advise.  

## 2013-07-24 ENCOUNTER — Ambulatory Visit (INDEPENDENT_AMBULATORY_CARE_PROVIDER_SITE_OTHER): Payer: 59 | Admitting: Medical

## 2013-07-24 ENCOUNTER — Encounter: Payer: Self-pay | Admitting: Medical

## 2013-07-24 VITALS — BP 100/80 | HR 68 | Temp 98.0°F | Resp 16 | Wt 142.0 lb

## 2013-07-24 DIAGNOSIS — K6289 Other specified diseases of anus and rectum: Secondary | ICD-10-CM

## 2013-07-24 DIAGNOSIS — Z202 Contact with and (suspected) exposure to infections with a predominantly sexual mode of transmission: Secondary | ICD-10-CM

## 2013-07-24 DIAGNOSIS — S01309A Unspecified open wound of unspecified ear, initial encounter: Secondary | ICD-10-CM

## 2013-07-24 DIAGNOSIS — S01312A Laceration without foreign body of left ear, initial encounter: Secondary | ICD-10-CM

## 2013-07-24 DIAGNOSIS — A6000 Herpesviral infection of urogenital system, unspecified: Secondary | ICD-10-CM

## 2013-07-24 DIAGNOSIS — Z2089 Contact with and (suspected) exposure to other communicable diseases: Secondary | ICD-10-CM

## 2013-07-24 DIAGNOSIS — Z8619 Personal history of other infectious and parasitic diseases: Secondary | ICD-10-CM

## 2013-07-24 LAB — POCT WET PREP (WET MOUNT): Clue Cells Wet Prep Whiff POC: NEGATIVE

## 2013-07-24 MED ORDER — VALACYCLOVIR HCL 1 G PO TABS: ORAL_TABLET | ORAL | Status: DC

## 2013-07-24 NOTE — Progress Notes (Signed)
Subjective: Here for f/u.  Was diagnosed with chlamydia back in May 2014.  Did 2 rounds of treatment.  Here to recheck on this.  No new sexual partners since then.  Has mainly been abstinent since last visit.  She notes hx/o genital herpes diagnosed 11/2012.  Since then has had a few minor outbreaks.  Only ever used Valtrex once.    She has questions about her left ear lobe.  Has split in ear lobe from prior piercing.    She notes rectal pain.  Gets intermittent pain a little above and below anus.  Nothing particular worsens.  This has been going on a few months.  Denies fall or injury.  No hx/o pilonidal cyst.  She denies redness, warmth, pus, no bleeding with intercourse, no vagina symptoms or urinary symptoms.  She does note hx/o constipation, but has been better about prevention.  She notes though that she is still not drinking a lot of water or eating a lot of fiber.  Denies fever, weight loss, sweats.   Past Medical History  Diagnosis Date  . Seasonal allergic rhinitis   . Anemia     longstanding, mild per patient  . Genital herpes 2014  . History of chlamydia 03/2013  . Abdominal pain 2014    ED visit, etiology constipation   ROS as in subjective  Objective: Gen: wd, wn, nad Ear - left lower ear lobe with split, no erythema or drainage.  Appears to be s/p piercing and heavy earrings. Back: nontender Buttocks nontender, nontender gluteal cleft and anus and perianal region.  No nodules, mass, fluctuance, warmth.  No signs of pilonidal cyst. Gyn: normal external genitalia, normal mucosa, cervix normal, no erythema, no discharge, swabs taken, no adnexal mass or tenderness, exam chaperoned by nurse   Assessment: Encounter Diagnoses  Name Primary?  . Rectal pain Yes  . History of chlamydia   . Venereal disease contact   . Genital herpes   . Torn earlobe, left, initial encounter    Plan: Rectal pain - etiology unclear.  Maybe related to BMs. Exam normal.  Advised she return with  acute pain if redness, swelling, warmth, or recheck in general.  otherwise increase water and fiber intake, and if symptoms persist 3-4 more weeks, then willl need xray and other eval. Hx/o chlamydia - recheck today with genital swab. Wet prep negative.  Discuss safe sex.   Genital herpes - went back over symptoms, prevention, means of transmission, medications for acute and preventative therapy.  Refilled Valtrex for acute therapy but advised  If >2 more episodes  Before end of the year, consider daily preventative suppressive therapy Torn earlobe - advised she call plastic surgery for consult

## 2013-07-25 LAB — GC/CHLAMYDIA PROBE AMP
CT Probe RNA: NEGATIVE
GC Probe RNA: NEGATIVE

## 2013-07-26 NOTE — Progress Notes (Signed)
LMOM TO CB. CLS 

## 2013-07-27 ENCOUNTER — Telehealth: Payer: Self-pay | Admitting: Family Medicine

## 2013-07-27 NOTE — Telephone Encounter (Signed)
Please call patient  Pt states she had her period about a week ago. Bleeding stopped for 3-4 days Now bleeding again, patient states it is different from bleeding with a period. Woke up in "puddle of blood"' this morning.  Bleeding today has been constant flow of blood, patient states it is like she has been cut. No other symptons, no pain, cramping etc  Patient concerned and wants to speak with someone

## 2013-07-27 NOTE — Telephone Encounter (Signed)
Have her take 800 mg of ibuprofen 3 times a day and see if this will help stop the flow. If continued difficulty have her call back tomorrow

## 2013-07-27 NOTE — Telephone Encounter (Signed)
PT INFORMED WORD FOR WORD AND VERBALIZED UNDERSTANDING  

## 2013-11-08 ENCOUNTER — Ambulatory Visit: Payer: 59 | Admitting: Medical

## 2013-11-10 ENCOUNTER — Encounter: Payer: Self-pay | Admitting: Medical

## 2013-11-10 ENCOUNTER — Ambulatory Visit (INDEPENDENT_AMBULATORY_CARE_PROVIDER_SITE_OTHER): Payer: 59 | Admitting: Medical

## 2013-11-10 VITALS — BP 100/60 | HR 92 | Temp 97.8°F | Resp 16 | Wt 140.5 lb

## 2013-11-10 DIAGNOSIS — K602 Anal fissure, unspecified: Secondary | ICD-10-CM

## 2013-11-10 DIAGNOSIS — A6000 Herpesviral infection of urogenital system, unspecified: Secondary | ICD-10-CM

## 2013-11-10 DIAGNOSIS — N898 Other specified noninflammatory disorders of vagina: Secondary | ICD-10-CM

## 2013-11-10 DIAGNOSIS — L293 Anogenital pruritus, unspecified: Secondary | ICD-10-CM

## 2013-11-10 MED ORDER — HYDROCORTISONE 2.5 % RE CREA: 1.0000 "application " | TOPICAL_CREAM | Freq: Two times a day (BID) | RECTAL | Status: DC

## 2013-11-10 MED ORDER — VALACYCLOVIR HCL 1 G PO TABS: ORAL_TABLET | ORAL | Status: DC

## 2013-11-10 NOTE — Progress Notes (Signed)
Subjective: She notes with BM, gets some blood on tissue, hurts to wipe during those times.   Lately in the last week, has had this happen daily.  She usually has BM daily.   Sees bright red blood on tissue.  Blood is not mixed in with the feces, not in the bowl.  Denies feeling weak, dizzy.  Not having constipation in general.   She is not drinking as much water as she probably should.    Has an odd vaginal odor.  Denies vaginal discharge.  She notes a new sexual partner since September.  He has HSV too, but no other known STD.   They use condoms every time.   She denies any changes in soap.   She has had a few recent HSV outbreaks.  Thinks she had one last week.   She noted some lesion that crusted over.  She is out of valtrex.   Currently on her menstrual period which is heavy.   Has some vaginal itching.   Just had outbreak of HSV last week that resolved.  Past Medical History  Diagnosis Date  . Seasonal allergic rhinitis   . Anemia     longstanding, mild per patient  . Genital herpes 2014  . History of chlamydia 03/2013  . Abdominal pain 2014    ED visit, etiology constipation   ROS as in subjective  Objective: Gen: wd, wn, nad Abdomen:nontender, no mass, no organomegaly Rectal: small posterior anal fissure, otherwise no external hemorrhoids, no other lesion, rectal tone normal, no obvious internal hemorrhoids palpated, occult negative stool Gyn: deferred at patient's request   Assessment: Encounter Diagnoses  Name Primary?  . Genital HSV Yes  . Vaginal itching   . Anal fissure      Plan: HSV - we will change her to daily suppressive therapy given the recent multiple outbreaks. Discussed both preventative and acute flare up use of the medication  Advise if she continues to have been vaginal itching or discharge or odor to return in a week or 2 when she is not on her menstrual period  Anal fissure - For the anal fissure, use Epsom salts and hot water soaks, begin the cream I  sent to the pharmacy for the next 5-7 days, increase water and fiber intake to prevent constipation.  Use baby wipes for wiping after bowel movements for the next week.  Call or return if not improving

## 2013-11-10 NOTE — Patient Instructions (Signed)
For HSV suppression, start taking the Valtrex 1 g daily. If after one month you have had no more flareups, cut them in half and take 500 mg or one half tablet daily for suppression.  For acute flareup, used 2 tablets twice a day for one day. Continue to use condoms.  If any more problems with vaginal itching or odor, then return  For the anal fissure, use Epsom salts and hot water soaks, begin the cream I sent to the pharmacy for the next 5-7 days, increase water and fiber intake to prevent constipation.  Use baby wipes for wiping after bowel movements for the next week

## 2014-01-10 ENCOUNTER — Ambulatory Visit (INDEPENDENT_AMBULATORY_CARE_PROVIDER_SITE_OTHER): Payer: 59 | Admitting: Medical

## 2014-01-10 ENCOUNTER — Encounter: Payer: Self-pay | Admitting: Medical

## 2014-01-10 VITALS — BP 92/70 | HR 68 | Temp 97.7°F | Wt 139.0 lb

## 2014-01-10 DIAGNOSIS — N898 Other specified noninflammatory disorders of vagina: Secondary | ICD-10-CM

## 2014-01-10 DIAGNOSIS — K602 Anal fissure, unspecified: Secondary | ICD-10-CM

## 2014-01-10 DIAGNOSIS — R300 Dysuria: Secondary | ICD-10-CM

## 2014-01-10 LAB — POCT URINALYSIS DIPSTICK
BILIRUBIN UA: NEGATIVE
GLUCOSE UA: NEGATIVE
KETONES UA: NEGATIVE
LEUKOCYTES UA: NEGATIVE
Nitrite, UA: NEGATIVE
RBC UA: NEGATIVE
Spec Grav, UA: 1.015
Urobilinogen, UA: NEGATIVE
pH, UA: 5

## 2014-01-10 LAB — POCT WET PREP (WET MOUNT)
Clue Cells Wet Prep Whiff POC: NEGATIVE
WBC WET PREP: NEGATIVE

## 2014-01-10 NOTE — Progress Notes (Addendum)
Subjective: Here for recheck. I saw her in January for similar symptoms.  Still has ongoing discomfort with urination and vaginal discharge.  Currently has odor with urination, urine is cloudy, lower abdomina pain, has ongoing vaginal discharge white, liquid.  No urinary frequency or urgency, no back pain, no fever.  Does have vaginal itching, but no blood in urine or vaginal bleeding. No lumps bumps or rash.  Last HSV outbreak December 2014.  No new sexual partners, using condoms.  No prior UTI.    Last visit also had blood in stool, discomfort from the rectum but this is completely cleared up   Objective: Gen:wd, wn, nad Skin: no rash or abnormality Abdomen: nontender, no mass Gyn: Normal external genitalia without lesions, vagina with normal mucosa, cervix without lesions, no cervical motion tenderness, slight white abnormal vaginal discharge from the os.  Swabs taken.  Exam chaperoned by nurse.     Assessment: Encounter Diagnoses  Name Primary?  . Dysuria Yes  . Vaginal discharge   . Anal fissure    Plan: Wet prep normal today, we'll send labs for GC Chlamydia and urine culture. Discussed the need to increase water intake, discussed keeping a consistent diet, not using harsher scented soaps for hygiene, followup pending labs.  Anal fissure resolved.

## 2014-01-11 LAB — GC/CHLAMYDIA PROBE AMP
CT PROBE, AMP APTIMA: NEGATIVE
GC PROBE AMP APTIMA: NEGATIVE

## 2014-01-12 ENCOUNTER — Other Ambulatory Visit: Payer: Self-pay | Admitting: Medical

## 2014-01-12 MED ORDER — CIPROFLOXACIN HCL 500 MG PO TABS: 500.0000 mg | ORAL_TABLET | Freq: Two times a day (BID) | ORAL | Status: DC

## 2014-01-12 NOTE — Progress Notes (Signed)
LMOM TO CB. CLS 

## 2014-01-13 LAB — URINE CULTURE

## 2014-08-16 IMAGING — CR DG CHEST 2V
2 series · 2 of 2 positions shown · non-contrast
Comparison: None

CLINICAL DATA: Chest pain.

CHEST - 2 VIEW

[PA]
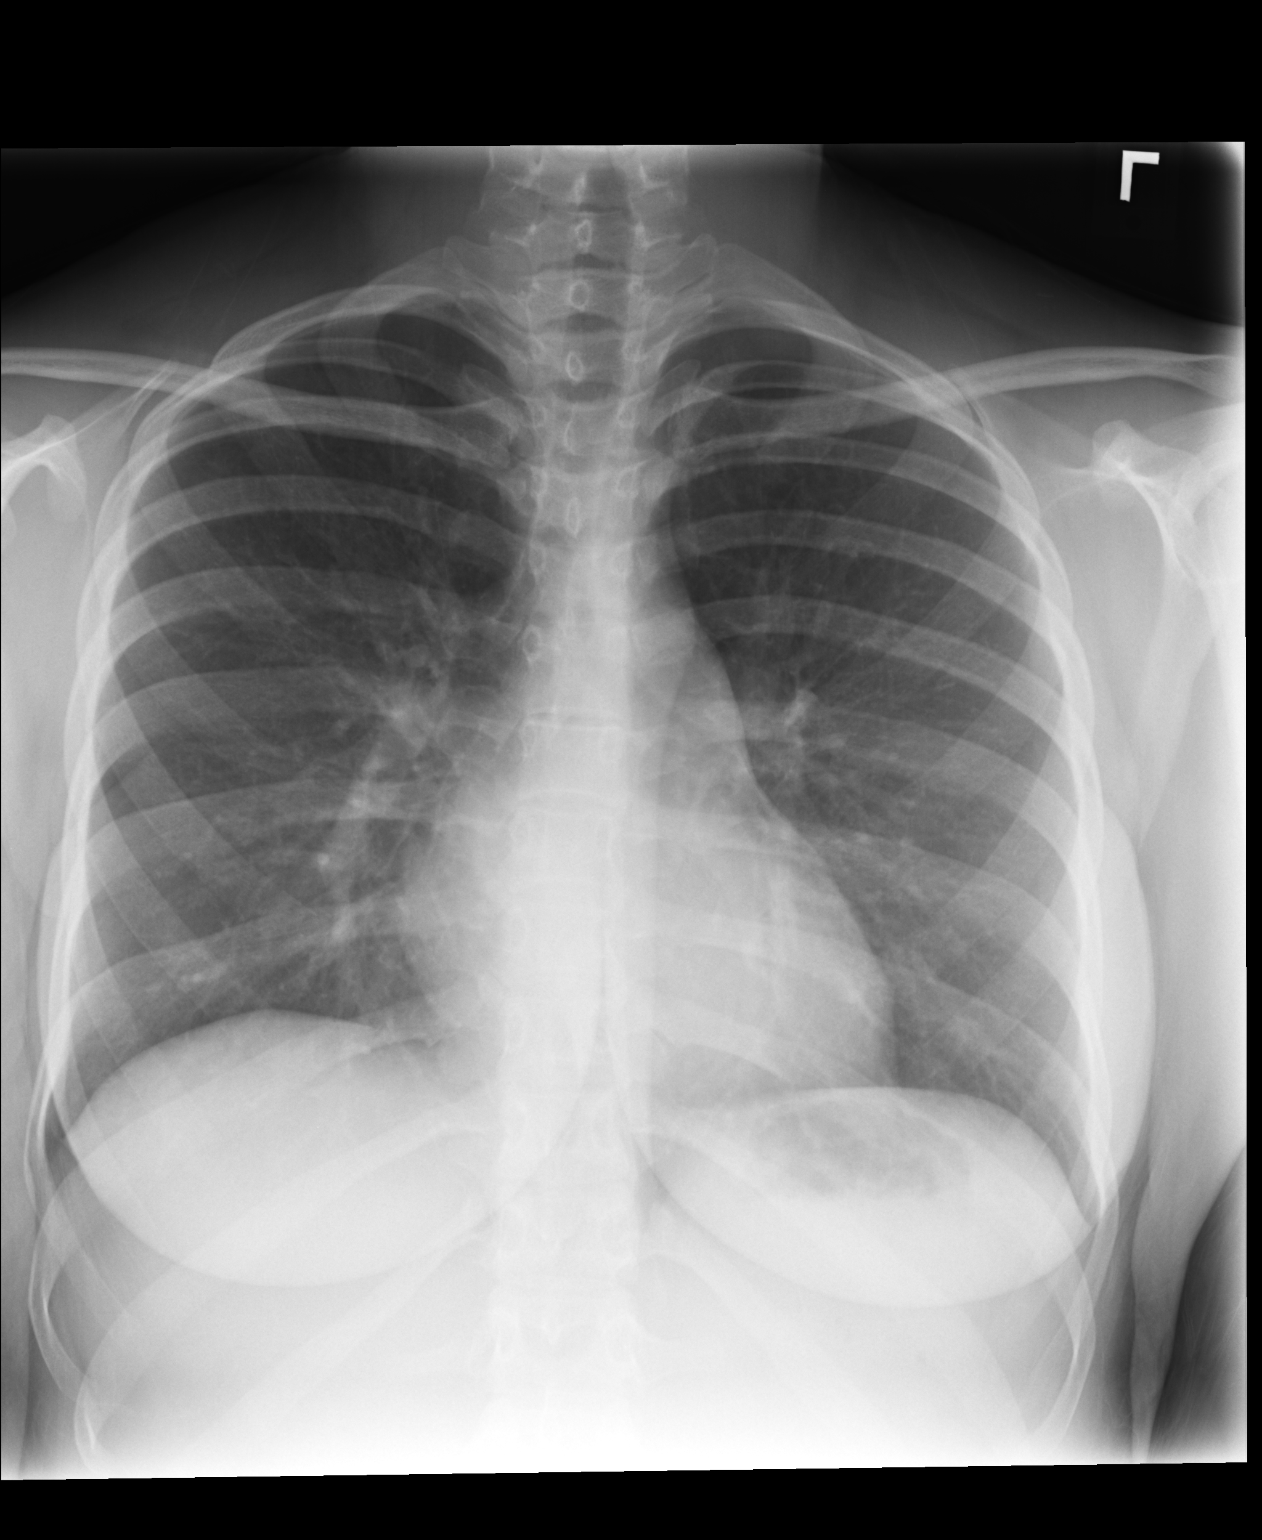

[lateral]
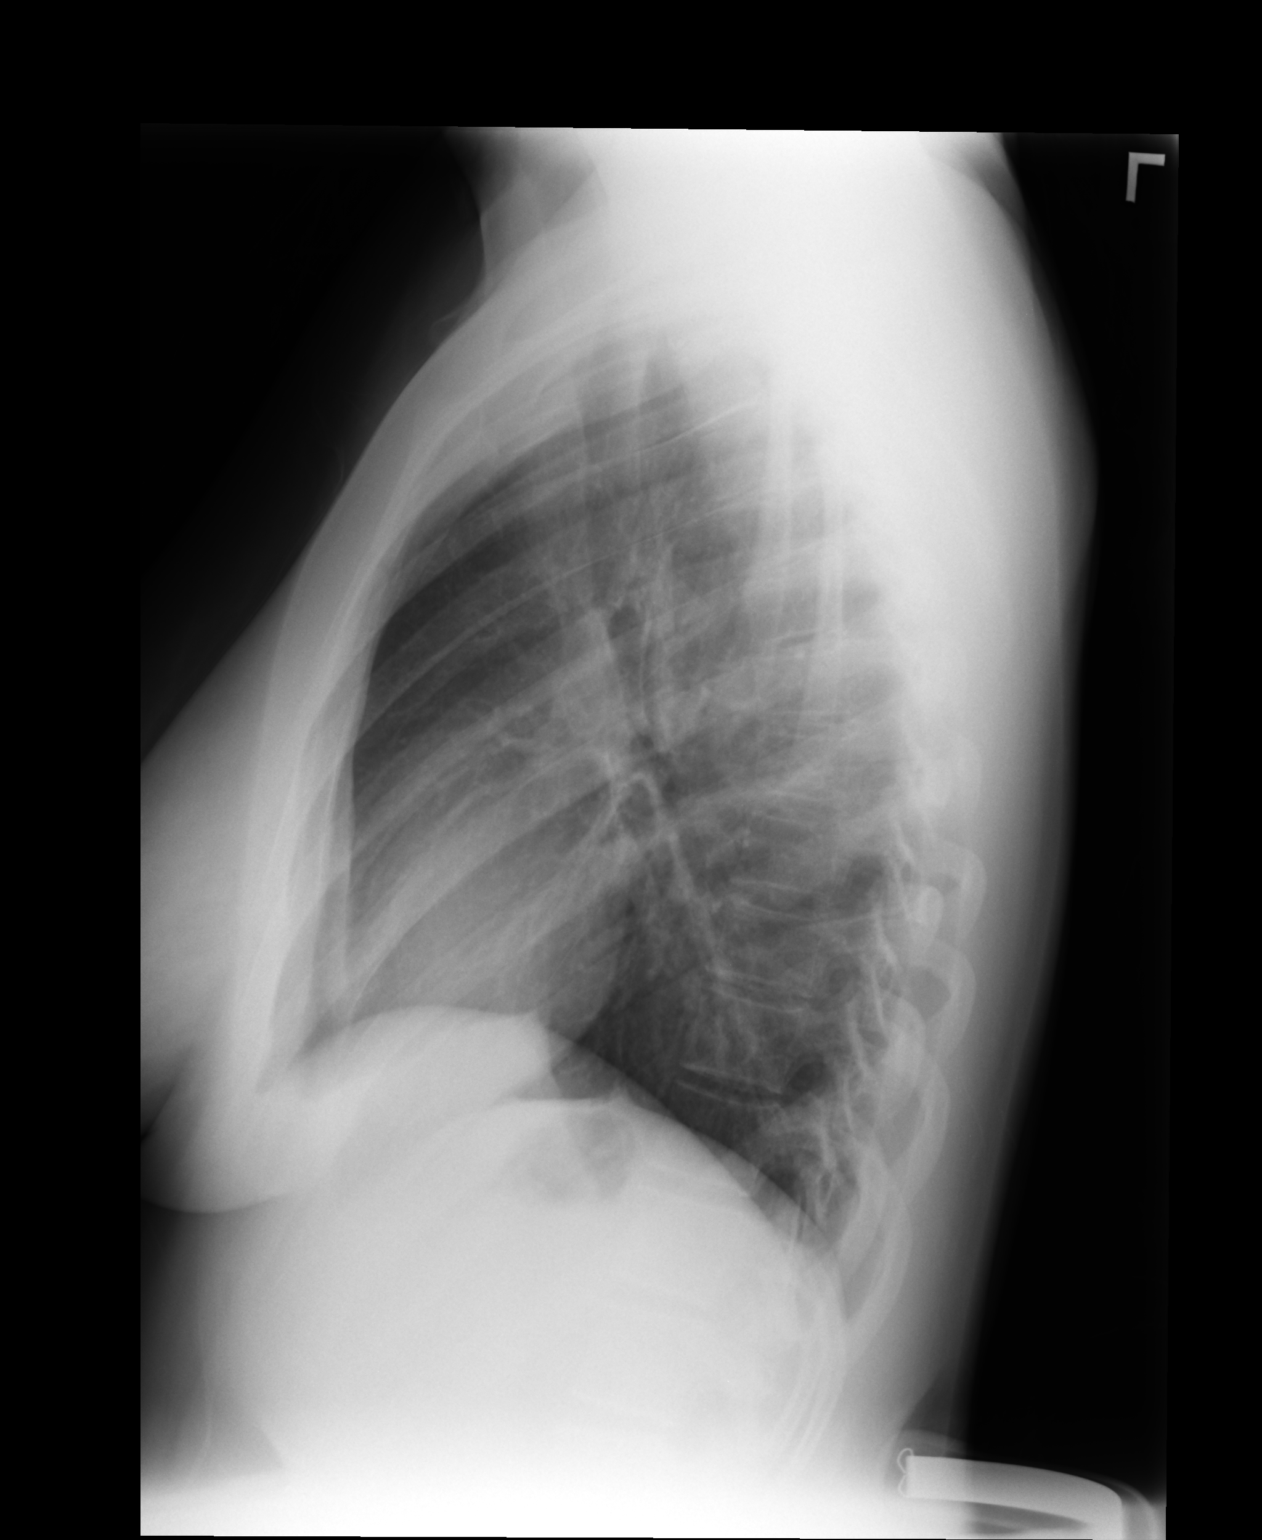

[2 of 2 positions shown; findings below may reference images not displayed]

FINDINGS: Heart and mediastinal contours are within normal limits.
No focal opacities or effusions.  No acute bony abnormality.
IMPRESSION: No active cardiopulmonary disease.

## 2014-10-18 ENCOUNTER — Encounter (HOSPITAL_COMMUNITY): Payer: Self-pay | Admitting: Emergency Medicine

## 2014-10-18 ENCOUNTER — Emergency Department (HOSPITAL_COMMUNITY)
Admission: EM | Admit: 2014-10-18 | Discharge: 2014-10-18 | Disposition: A | Discharge status: Home / Self Care | Payer: 59 | Attending: Emergency Medicine | Admitting: Emergency Medicine

## 2014-10-18 ENCOUNTER — Emergency Department (HOSPITAL_COMMUNITY): Payer: 59

## 2014-10-18 DIAGNOSIS — Z7952 Long term (current) use of systemic steroids: Secondary | ICD-10-CM | POA: Diagnosis not present

## 2014-10-18 DIAGNOSIS — R110 Nausea: Secondary | ICD-10-CM | POA: Diagnosis not present

## 2014-10-18 DIAGNOSIS — Y92410 Unspecified street and highway as the place of occurrence of the external cause: Secondary | ICD-10-CM | POA: Insufficient documentation

## 2014-10-18 DIAGNOSIS — S199XXA Unspecified injury of neck, initial encounter: Secondary | ICD-10-CM

## 2014-10-18 DIAGNOSIS — Z792 Long term (current) use of antibiotics: Secondary | ICD-10-CM | POA: Diagnosis not present

## 2014-10-18 DIAGNOSIS — Y9389 Activity, other specified: Secondary | ICD-10-CM | POA: Insufficient documentation

## 2014-10-18 DIAGNOSIS — Z8619 Personal history of other infectious and parasitic diseases: Secondary | ICD-10-CM | POA: Diagnosis not present

## 2014-10-18 DIAGNOSIS — S0990XA Unspecified injury of head, initial encounter: Secondary | ICD-10-CM | POA: Diagnosis present

## 2014-10-18 DIAGNOSIS — S0083XA Contusion of other part of head, initial encounter: Secondary | ICD-10-CM | POA: Insufficient documentation

## 2014-10-18 DIAGNOSIS — S0093XA Contusion of unspecified part of head, initial encounter: Secondary | ICD-10-CM

## 2014-10-18 DIAGNOSIS — Y998 Other external cause status: Secondary | ICD-10-CM | POA: Diagnosis not present

## 2014-10-18 DIAGNOSIS — Z862 Personal history of diseases of the blood and blood-forming organs and certain disorders involving the immune mechanism: Secondary | ICD-10-CM | POA: Diagnosis not present

## 2014-10-18 DIAGNOSIS — Z79899 Other long term (current) drug therapy: Secondary | ICD-10-CM | POA: Insufficient documentation

## 2014-10-18 LAB — I-STAT CHEM 8, ED
BUN: 9 mg/dL (ref 6–23)
CALCIUM ION: 1.19 mmol/L (ref 1.12–1.23)
CHLORIDE: 106 meq/L (ref 96–112)
Creatinine, Ser: 0.9 mg/dL (ref 0.50–1.10)
Glucose, Bld: 83 mg/dL (ref 70–99)
HEMATOCRIT: 38 % (ref 36.0–46.0)
Hemoglobin: 12.9 g/dL (ref 12.0–15.0)
Potassium: 3.7 mEq/L (ref 3.7–5.3)
SODIUM: 140 meq/L (ref 137–147)
TCO2: 22 mmol/L (ref 0–100)

## 2014-10-18 MED ORDER — NAPROXEN 500 MG PO TABS: 500.0000 mg | ORAL_TABLET | Freq: Two times a day (BID) | ORAL | Status: DC

## 2014-10-18 MED ORDER — SODIUM CHLORIDE 0.9 % IV SOLN
Freq: Once | INTRAVENOUS | Status: AC
  Administered 2014-10-18: 20:00:00 via INTRAVENOUS

## 2014-10-18 MED ORDER — TRAMADOL HCL 50 MG PO TABS: 50.0000 mg | ORAL_TABLET | Freq: Four times a day (QID) | ORAL | Status: DC | PRN

## 2014-10-18 MED ORDER — IOHEXOL 300 MG/ML  SOLN
75.0000 mL | Freq: Once | INTRAMUSCULAR | Status: AC | PRN
  Administered 2014-10-18: 75 mL via INTRAVENOUS

## 2014-10-18 NOTE — ED Notes (Signed)
PA at the bedside.
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
## 2014-10-18 NOTE — Discharge Instructions (Signed)
Your Ct was negative for neck fractures (hyoid, larynx or spine). Please apply ice to the affected area and take pain medication as needed. Return to the ED if you have difficulty breathing or are unable to swallow due to swelling  Head Injury You have received a head injury. It does not appear serious at this time. Headaches and vomiting are common following head injury. It should be easy to awaken from sleeping. Sometimes it is necessary for you to stay in the emergency department for a while for observation. Sometimes admission to the hospital may be needed. After injuries such as yours, most problems occur within the first 24 hours, but side effects may occur up to 7-10 days after the injury. It is important for you to carefully monitor your condition and contact your health care provider or seek immediate medical care if there is a change in your condition. WHAT ARE THE TYPES OF HEAD INJURIES? Head injuries can be as minor as a bump. Some head injuries can be more severe. More severe head injuries include:  A jarring injury to the brain (concussion).  A bruise of the brain (contusion). This mean there is bleeding in the brain that can cause swelling.  A cracked skull (skull fracture).  Bleeding in the brain that collects, clots, and forms a bump (hematoma). WHAT CAUSES A HEAD INJURY? A serious head injury is most likely to happen to someone who is in a car wreck and is not wearing a seat belt. Other causes of major head injuries include bicycle or motorcycle accidents, sports injuries, and falls. HOW ARE HEAD INJURIES DIAGNOSED? A complete history of the event leading to the injury and your current symptoms will be helpful in diagnosing head injuries. Many times, pictures of the brain, such as CT or MRI are needed to see the extent of the injury. Often, an overnight hospital stay is necessary for observation.  WHEN SHOULD I SEEK IMMEDIATE MEDICAL CARE?  You should get help right away  if:  You have confusion or drowsiness.  You feel sick to your stomach (nauseous) or have continued, forceful vomiting.  You have dizziness or unsteadiness that is getting worse.  You have severe, continued headaches not relieved by medicine. Only take over-the-counter or prescription medicines for pain, fever, or discomfort as directed by your health care provider.  You do not have normal function of the arms or legs or are unable to walk.  You notice changes in the black spots in the center of the colored part of your eye (pupil).  You have a clear or bloody fluid coming from your nose or ears.  You have a loss of vision. During the next 24 hours after the injury, you must stay with someone who can watch you for the warning signs. This person should contact local emergency services (911 in the [place] if you have seizures, you become unconscious, or you are unable to wake up. HOW CAN I PREVENT A HEAD INJURY IN THE FUTURE? The most important factor for preventing major head injuries is avoiding motor vehicle accidents. To minimize the potential for damage to your head, it is crucial to wear seat belts while riding in motor vehicles. Wearing helmets while bike riding and playing collision sports (like football) is also helpful. Also, avoiding dangerous activities around the house will further help reduce your risk of head injury.  WHEN CAN I RETURN TO NORMAL ACTIVITIES AND ATHLETICS? You should be reevaluated by your health care provider before returning to  these activities. If you have any of the following symptoms, you should not return to activities or contact sports until 1 week after the symptoms have stopped:  Persistent headache.  Dizziness or vertigo.  Poor attention and concentration.  Confusion.  Memory problems.  Nausea or vomiting.  Fatigue or tire easily.  Irritability.  Intolerant of bright lights or loud noises.  Anxiety or depression.  Disturbed sleep. MAKE  SURE YOU:   Understand these instructions.  Will watch your condition.  Will get help right away if you are not doing well or get worse. Document Released: 10/19/2005 Document Revised: 10/24/2013 Document Reviewed: 06/26/2013 [facility] Patient Information 2015 [place]. This information is not intended to replace advice given to you by your health care provider. Make sure you discuss any questions you have with your health care provider.  Soft Tissue Injury of the Neck A soft tissue injury of the neck may be either blunt or penetrating. A blunt injury does not break the skin. A penetrating injury breaks the skin, creating an open wound. Blunt injuries may happen in several ways. Most involve some type of direct blow to the neck. This can cause serious injury to the windpipe, voice box, cervical spine, or esophagus. In some cases, the injury to the soft tissue can also result in a break (fracture) of the cervical spine.  Soft tissue injuries of the neck require immediate medical care. Sometimes, you may not notice the signs of injury right away. You may feel fine at first, but the swelling may eventually close off your airway. This could result in a significant or life-threatening injury. This is rare, but it is important to keep in mind with any injury to the neck.  CAUSES  Causes of blunt injury may include:  "Clothesline" injuries. This happens when someone is moving at high speed and runs into a clothesline, outstretched arm, or similar object. This results in a direct injury to the front of the neck. If the airway is blocked, it can cause suffocation due to lack of oxygen (asphyxiation) or even instant death.  High-energy trauma. This includes injuries from motor vehicle crashes, falling from a great height, or heavy objects falling onto the neck.  Sports-related injuries. Injury to the windpipe and voice box can result from being struck by another player or being struck by an object,  such as a baseball, hockey stick, or an outstretched arm.  Strangulation. This type of injury may cause skin trauma, hoarseness of voice, or broken cartilage in the voice box or windpipe. It may also cause a serious airway problem. SYMPTOMS   Bruising.  Pain and tenderness in the neck.  Swelling of the neck and face.  Hoarseness of voice.  Pain or difficulty with swallowing.  Drooling or inability to swallow.  Trouble breathing. This may become worse when lying flat.  Coughing up blood.  High-pitched, harsh, vibratory noise due to partial obstruction of the windpipe (stridor).  Swelling of the upper arms.  Windpipe that appears to be pushed off to one side.  Air in the tissues under the skin of the neck or chest (subcutaneous emphysema). This usually indicates a problem with the normal airway and is a medical emergency. DIAGNOSIS   If possible, your caregiver may ask about the details of how the injury occurred. A detailed exam can help to identify specific areas of the neck that are injured.  Your caregiver may ask for tests to rule out injury of the voice box, airway, or  esophagus. This may include X-rays, ultrasounds, CT scans, or MRI scans, depending on the severity of your injury. TREATMENT  If you have an injury to your windpipe or voice box, immediate medical care is required. In almost all cases, hospitalization is necessary. For injuries that do not appear to require surgery, it is helpful to have medical observation for 24 hours. You may be asked to do one or more of the following:  Rest your voice.  Bed rest.  Limit your diet, depending on the extent of the injury. Follow your caregiver's dietary guidelines. Often, only fluids and soft foods are recommended.  Keep your head raised.  Breathe humidified air.  Take medicines to control infection, reduce swelling, and reduce normal stomach acid. You may also need pain medicine, depending on your injury. For  injuries that appear to require surgery, you will need to stay in the hospital. The exact type of procedure needed will depend on your exact injury or injuries.  HOME CARE INSTRUCTIONS   If the skin was broken, keep the wound area clean and dry. Wear your bandage (dressing) and care for your wound as instructed.  Follow your caregiver's advice about your diet.  Follow your caregiver's advice about use of your voice.  Take medicines as directed.  Keep your head and neck at least partially raised (elevated) while recovering. This should also be done while sleeping. SEEK MEDICAL CARE IF:   Your voice becomes weaker.  Your swelling or bruising is not improving as expected. Typically, this takes several days to improve.  You feel that you are having problems with medicines prescribed.  You have drainage from the injury site. This may be a sign that your wound is not healing properly or is infected.  You develop increasing pain or difficulty while swallowing.  You develop an oral temperature of 102 F (38.9 C) or higher. SEEK IMMEDIATE MEDICAL CARE IF:   You cough up blood.  You develop sudden trouble breathing.  You cannot tolerate your oral medicines, or you are unable to swallow.  You develop drooling.  You have new or worsening vomiting.  You develop sudden, new swelling of the neck or face.  You have an oral temperature above 102 F (38.9 C), not controlled by medicine. MAKE SURE YOU:  Understand these instructions.  Will watch your condition.  Will get help right away if you are not doing well or get worse. Document Released: 01/26/2008 Document Revised: 01/11/2012 Document Reviewed: 01/05/2011 [facility] Patient Information 2015 [place], [place]. This information is not intended to replace advice given to you by your health care provider. Make sure you discuss any questions you have with your health care provider.
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
## 2014-10-22 ENCOUNTER — Encounter: Payer: Self-pay | Admitting: Medical

## 2014-10-22 ENCOUNTER — Ambulatory Visit (INDEPENDENT_AMBULATORY_CARE_PROVIDER_SITE_OTHER): Payer: 59 | Admitting: Medical

## 2014-10-22 VITALS — BP 100/60 | HR 60 | Temp 98.1°F | Resp 16 | Wt 134.0 lb

## 2014-10-22 DIAGNOSIS — M542 Cervicalgia: Secondary | ICD-10-CM

## 2014-10-22 DIAGNOSIS — N898 Other specified noninflammatory disorders of vagina: Secondary | ICD-10-CM

## 2014-10-22 MED ORDER — VALACYCLOVIR HCL 500 MG PO TABS: ORAL_TABLET | ORAL | Status: DC

## 2014-10-22 MED ORDER — FLUCONAZOLE 150 MG PO TABS
ORAL_TABLET | ORAL | Status: DC
Start: 2014-10-22 — End: 2015-05-22
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
## 2014-10-23 LAB — GC/CHLAMYDIA PROBE AMP
CT PROBE, AMP APTIMA: NEGATIVE
GC Probe RNA: NEGATIVE

## 2015-02-11 ENCOUNTER — Institutional Professional Consult (permissible substitution): Payer: Self-pay | Admitting: Medical

## 2015-02-14 ENCOUNTER — Institutional Professional Consult (permissible substitution): Payer: Self-pay | Admitting: Medical

## 2015-02-21 ENCOUNTER — Institutional Professional Consult (permissible substitution): Payer: Self-pay | Admitting: Medical

## 2015-02-21 ENCOUNTER — Telehealth: Payer: Self-pay

## 2015-02-21 NOTE — Telephone Encounter (Signed)
This patient no showed for their appointment today.Which of the following is necessary for this patient.   A) No follow-up necessary   B) Follow-up urgent. Locate Patient Immediately.   C) Follow-up necessary. Contact patient and Schedule visit in ____ Days.   D) Follow-up Advised. Contact patient and Schedule visit in ____ Days. 

## 2015-02-21 NOTE — Telephone Encounter (Signed)
Tomi BambergerWendy I dont call pt for missed appts. I only send letters. This needs to go to you.

## 2015-02-21 NOTE — Telephone Encounter (Signed)
Olegario MessierKathy, D

## 2015-02-21 NOTE — Telephone Encounter (Signed)
LM for patient to call back to reschedule no show from this morning.

## 2015-02-26 NOTE — Telephone Encounter (Signed)
SEE MSG BELOW

## 2015-02-28 NOTE — Telephone Encounter (Signed)
LM to CB to Reschedule appt

## 2015-03-05 ENCOUNTER — Encounter: Payer: Self-pay | Admitting: Medical

## 2015-05-03 HISTORY — PX: NO PAST SURGERIES: SHX2092

## 2015-05-08 ENCOUNTER — Encounter: Payer: Self-pay | Admitting: Medical

## 2015-05-21 ENCOUNTER — Encounter: Payer: Self-pay | Admitting: Medical

## 2015-05-21 ENCOUNTER — Other Ambulatory Visit (HOSPITAL_COMMUNITY)
Admission: RE | Admit: 2015-05-21 | Discharge: 2015-05-21 | Disposition: A | Discharge status: Home / Self Care | Payer: Commercial Managed Care - HMO | Source: Ambulatory Visit | Attending: Medical | Admitting: Medical

## 2015-05-21 ENCOUNTER — Ambulatory Visit (INDEPENDENT_AMBULATORY_CARE_PROVIDER_SITE_OTHER): Payer: Commercial Managed Care - HMO | Admitting: Medical

## 2015-05-21 VITALS — BP 100/60 | HR 59 | Temp 98.3°F | Resp 16 | Ht 63.0 in | Wt 137.0 lb

## 2015-05-21 DIAGNOSIS — Z113 Encounter for screening for infections with a predominantly sexual mode of transmission: Secondary | ICD-10-CM | POA: Insufficient documentation

## 2015-05-21 DIAGNOSIS — Z0000 Encounter for general adult medical examination without abnormal findings: Secondary | ICD-10-CM | POA: Diagnosis not present

## 2015-05-21 DIAGNOSIS — Z124 Encounter for screening for malignant neoplasm of cervix: Secondary | ICD-10-CM | POA: Diagnosis not present

## 2015-05-21 DIAGNOSIS — Z01419 Encounter for gynecological examination (general) (routine) without abnormal findings: Secondary | ICD-10-CM | POA: Diagnosis present

## 2015-05-21 DIAGNOSIS — A6000 Herpesviral infection of urogenital system, unspecified: Secondary | ICD-10-CM | POA: Diagnosis not present

## 2015-05-21 DIAGNOSIS — Z13220 Encounter for screening for lipoid disorders: Secondary | ICD-10-CM | POA: Diagnosis not present

## 2015-05-21 LAB — CBC WITH DIFFERENTIAL/PLATELET
BASOS ABS: 0 10*3/uL (ref 0.0–0.1)
Basophils Relative: 0 % (ref 0–1)
EOS ABS: 0.1 10*3/uL (ref 0.0–0.7)
EOS PCT: 2 % (ref 0–5)
HEMATOCRIT: 38.6 % (ref 36.0–46.0)
Hemoglobin: 12.2 g/dL (ref 12.0–15.0)
Lymphocytes Relative: 28 % (ref 12–46)
Lymphs Abs: 1.6 10*3/uL (ref 0.7–4.0)
MCH: 22.6 pg — ABNORMAL LOW (ref 26.0–34.0)
MCHC: 31.6 g/dL (ref 30.0–36.0)
MCV: 71.3 fL — ABNORMAL LOW (ref 78.0–100.0)
MONO ABS: 0.4 10*3/uL (ref 0.1–1.0)
MONOS PCT: 8 % (ref 3–12)
Neutro Abs: 3.5 10*3/uL (ref 1.7–7.7)
Neutrophils Relative %: 62 % (ref 43–77)
Platelets: 219 10*3/uL (ref 150–400)
RBC: 5.41 MIL/uL — ABNORMAL HIGH (ref 3.87–5.11)
RDW: 15.9 % — ABNORMAL HIGH (ref 11.5–15.5)
WBC: 5.6 10*3/uL (ref 4.0–10.5)

## 2015-05-21 LAB — LIPID PANEL
CHOLESTEROL: 186 mg/dL (ref 0–200)
HDL: 72 mg/dL (ref >=46)
LDL Cholesterol: 106 mg/dL — ABNORMAL HIGH (ref 0–99)
Total CHOL/HDL Ratio: 2.6 Ratio
Triglycerides: 41 mg/dL (ref <150)
VLDL: 8 mg/dL (ref 0–40)

## 2015-05-21 MED ORDER — VALACYCLOVIR HCL 500 MG PO TABS: ORAL_TABLET | ORAL | Status: AC
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
## 2015-05-21 NOTE — Patient Instructions (Signed)
Recommendations  Get me a copy of your vaccines from school, [employer]  Get a yearly flu shot in [place].   Our supply will be coming in late August  Exercise daily  Eat a healthy diet See your dentist yearly for routine dental care including hygiene visits twice yearly. We will call with lab results We sent your pap smear today   Dr. Yancey Flemingsavid Civils, dentist [address], [place], [place] [address] [phone] [email or web address]

## 2015-05-22 ENCOUNTER — Encounter: Payer: Self-pay | Admitting: Medical

## 2015-05-22 LAB — HIV ANTIBODY (ROUTINE TESTING W REFLEX): HIV 1&2 Ab, 4th Generation: NONREACTIVE

## 2015-05-22 LAB — RPR

## 2015-05-23 LAB — CYTOLOGY - PAP

## 2015-05-29 ENCOUNTER — Encounter: Payer: Self-pay | Admitting: Family Medicine
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
## 2017-12-07 ENCOUNTER — Encounter: Payer: Self-pay | Admitting: Medical

## 2017-12-07 ENCOUNTER — Ambulatory Visit: Payer: 59 | Admitting: Medical

## 2017-12-07 ENCOUNTER — Other Ambulatory Visit (HOSPITAL_COMMUNITY)
Admission: RE | Admit: 2017-12-07 | Discharge: 2017-12-07 | Disposition: A | Discharge status: Home / Self Care | Payer: 59 | Source: Ambulatory Visit | Attending: Medical | Admitting: Medical

## 2017-12-07 VITALS — BP 108/70 | HR 85 | Wt 134.8 lb

## 2017-12-07 DIAGNOSIS — Z113 Encounter for screening for infections with a predominantly sexual mode of transmission: Secondary | ICD-10-CM

## 2017-12-07 DIAGNOSIS — Z124 Encounter for screening for malignant neoplasm of cervix: Secondary | ICD-10-CM | POA: Insufficient documentation

## 2017-12-07 DIAGNOSIS — L729 Follicular cyst of the skin and subcutaneous tissue, unspecified: Secondary | ICD-10-CM | POA: Diagnosis not present

## 2017-12-07 DIAGNOSIS — N898 Other specified noninflammatory disorders of vagina: Secondary | ICD-10-CM

## 2017-12-07 DIAGNOSIS — M67431 Ganglion, right wrist: Secondary | ICD-10-CM | POA: Diagnosis not present

## 2017-12-07 DIAGNOSIS — Z0000 Encounter for general adult medical examination without abnormal findings: Secondary | ICD-10-CM | POA: Diagnosis not present

## 2017-12-07 LAB — HM PAP SMEAR: HM Pap smear: NEGATIVE

## 2017-12-07 NOTE — Progress Notes (Signed)
Subjective:   HPI  Alyssa Galvan is a 27 y.o. female who presents for physical Chief Complaint  Patient presents with  . 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 Doing well, excited about this.  Moving to [place] later this year.  She has a few concerns.  Has lump in right armpit area/chest wall, no redness, no drainage, not painful, just appeared 3 weeks ago  Has lump on right hand for months, no pain, no redness.  She has possible lump in left lower breast  She has recurrent vaginal discharge, cyclic somewhat, mild, gets odor that is mild but unpleasant.     LMP 1/7ish more regular, not heavy, no desire to be on birth control.  Not currently sexually active.  Not on birth control  Reviewed their medical, surgical, family, social, medication, and allergy history and updated chart as appropriate.  Past Medical History:  Diagnosis Date  . Abdominal pain 2014   ED visit, etiology constipation  . Anemia    longstanding, mild per patient  . Genital herpes 2014  . History of chlamydia 03/2013  . Seasonal allergic rhinitis     Past Surgical History:  Procedure Laterality Date  . NO PAST SURGERIES  05/2015    Social History   Socioeconomic History  . Marital status: Single    Spouse name: Not on file  . Number of children: Not on file  . Years of education: Not on file  . Highest education level: Not on file  Social Needs  . Financial resource strain: Not on file  . Food insecurity - worry: Not on file  . Food insecurity - inability: Not on file  . Transportation needs - medical: Not on file  . Transportation needs - non-medical: Not on file  Occupational History  . Not on file  Tobacco Use  . Smoking status: Never Smoker  . Smokeless tobacco: Never Used  Substance and  Sexual Activity  . Alcohol use: No  . Drug use: No  . Sexual activity: Not Currently    Birth control/protection: None  Other Topics Concern  . 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 Cancer Paternal Grandmother        breast  . Heart disease Paternal Grandfather   . Stroke Paternal Grandfather   . COPD Father   . Diabetes Neg Hx      Current Outpatient Medications:  .  valACYclovir (VALTREX) 500 MG tablet, 1 tablet po BID x 3 days for out break (Patient not taking: Reported on 12/07/2017), Disp: 30 tablet, Rfl: 1  No Known Allergies   Review of Systems Constitutional: -fever, -chills, -sweats, -unexpected weight change, -decreased appetite, -fatigue Allergy: -sneezing, -itching, -congestion Dermatology: -changing moles, --rash, +lumps ENT: -runny nose, -ear pain, -sore throat, -hoarseness, -sinus pain, -teeth pain, - ringing in ears, -hearing loss, -nosebleeds Cardiology: -chest pain, -palpitations, -swelling, -difficulty breathing when lying flat, -waking up short of breath Respiratory: -cough, -shortness of breath, -difficulty breathing with exercise or exertion, -wheezing, -coughing up blood Gastroenterology: -abdominal pain, -  nausea, -vomiting, -diarrhea, -constipation, -blood in stool, -changes in bowel movement, -difficulty swallowing or eating Hematology: -bleeding, -bruising  Musculoskeletal: -joint aches, -muscle aches, -joint swelling, -back pain, -neck pain, -cramping, -changes in gait Ophthalmology: denies vision changes, eye redness, itching, discharge Urology: -burning with urination, -difficulty urinating, -blood in urine, -urinary frequency, -urgency, -incontinence Neurology: -headache, -weakness, -tingling, -numbness, -memory loss, -falls,  -dizziness Psychology: -depressed mood, -agitation, -sleep problems Breast/gyn: +breast tenderness, -discharge, -lumps, +vaginal discharge,- irregular periods, -heavy periods     Objective:   BP 108/70   Pulse 85   Wt 134 lb 12.8 oz (61.1 kg)   SpO2 97%   BMI 23.88 kg/m   General appearance: alert, no distress, WD/WN, African American female Skin: right base of arm, lateral chest wall/axilla region with 3 cm round mobile fluid filled cystic lesion, well demarcated, nontender, no erythema, no induration, no warmth.  Right dorsal hand over lateral wrist with 1cm mobile ganglion cyst, otherwise skin unremarkable HEENT: normocephalic, conjunctiva/corneas normal, sclerae anicteric, PERRLA, EOMi, nares patent, no discharge or erythema, pharynx normal Oral cavity: MMM, tongue normal, teeth normal Neck: supple, no lymphadenopathy, no thyromegaly, no masses, normal ROM, no bruits Chest: non tender, normal shape and expansion Heart: RRR, normal S1, S2, no murmurs Lungs: CTA bilaterally, no wheezes, rhonchi, or rales Abdomen: +bs, soft, non tender, non distended, no masses, no hepatomegaly, no splenomegaly, no bruits Back: non tender, normal ROM, no scoliosis Musculoskeletal: upper extremities non tender, no obvious deformity, normal ROM throughout, lower extremities non tender, no obvious deformity, normal ROM throughout Extremities: no edema, no cyanosis, no clubbing Pulses: 2+ symmetric, upper and lower extremities, normal cap refill Neurological: alert, oriented x 3, CN2-12 intact, strength normal upper extremities and lower extremities, sensation normal throughout, DTRs 2+ throughout, no cerebellar signs, gait normal Psychiatric: normal affect, behavior normal, pleasant  Breast: nontender, no masses or lumps, no skin changes, no nipple discharge or inversion, no axillary lymphadenopathy Gyn: Normal external genitalia without lesions, vagina with normal mucosa, cervix without lesions, no  cervical motion tenderness, mild whitish vaginal discharge.  Uterus and adnexa not enlarged, nontender, no masses.  Pap performed.  Exam chaperoned by nurse. Rectal: anus normal appearing   Assessment and Plan :    Encounter Diagnoses  Name Primary?  . Encounter for health maintenance examination in adult Yes  . Vaginal discharge   . Screen for STD (sexually transmitted disease)   . Screening for cervical cancer   . Ganglion cyst of dorsum of right wrist   . Cyst of skin     Physical exam - discussed and counseled on healthy lifestyle, diet, exercise, preventative care, vaccinations, sick and well care, proper use of emergency dept and after hours care, and addressed their concerns.    Health screening: See your dentist yearly for routine dental care including hygiene visits twice yearly.  Discussed STD testing, discussed prevention, condom use, means of transmission  Cancer screening Discussed and advised monthly self breast exams Discussed pap smear recommendations.   Pap smear today  Vaccinations: Counseled on the following vaccines:  Flu, Tdap.  Separate significant chronic issues discussed: Ganglion cyst of rigth hand - reassured.   If she decides she wants it removed, we can refer to hand surgery  Cyst of right upper lateral chest wall - offered US for eval, but very likely benign fluid filled cyst.   She will consider US vs watch and wait approach.  Vaginal discharge - wet prep here in house normal. Await STD screen.  Mariane was seen today for med check.  Diagnoses and all orders for this visit:  Encounter for health maintenance examination in adult -     HIV antibody -     RPR -     POCT Wet Prep [facility]) -     Cytology - PAP -     Comprehensive metabolic panel -     CBC -     TSH  Vaginal discharge -     POCT Wet Prep [facility])  Screen for STD (sexually transmitted disease) -     HIV antibody -     RPR -     Cytology - PAP  Screening for  cervical cancer -     Cytology - PAP  Ganglion cyst of dorsum of right wrist  Cyst of skin   Follow-up pending labs, yearly for physical

## 2017-12-08 LAB — RPR: RPR Ser Ql: NONREACTIVE

## 2017-12-08 LAB — CBC
Hematocrit: 39.3 % (ref 34.0–46.6)
Hemoglobin: 12.9 g/dL (ref 11.1–15.9)
MCH: 23.6 pg — ABNORMAL LOW (ref 26.6–33.0)
MCHC: 32.8 g/dL (ref 31.5–35.7)
MCV: 72 fL — ABNORMAL LOW (ref 79–97)
Platelets: 209 10*3/uL (ref 150–379)
RBC: 5.46 x10E6/uL — AB (ref 3.77–5.28)
RDW: 15.7 % — ABNORMAL HIGH (ref 12.3–15.4)
WBC: 7.6 10*3/uL (ref 3.4–10.8)

## 2017-12-08 LAB — COMPREHENSIVE METABOLIC PANEL
ALT: 10 IU/L (ref 0–32)
AST: 14 IU/L (ref 0–40)
Albumin/Globulin Ratio: 1.7 (ref 1.2–2.2)
Albumin: 4.5 g/dL (ref 3.5–5.5)
Alkaline Phosphatase: 48 IU/L (ref 39–117)
BUN/Creatinine Ratio: 14 (ref 9–23)
BUN: 11 mg/dL (ref 6–20)
Bilirubin Total: <0.2 mg/dL (ref 0.0–1.2)
CALCIUM: 9.2 mg/dL (ref 8.7–10.2)
CHLORIDE: 103 mmol/L (ref 96–106)
CO2: 21 mmol/L (ref 20–29)
Creatinine, Ser: 0.81 mg/dL (ref 0.57–1.00)
GFR calc non Af Amer: 102 mL/min/{1.73_m2} (ref >59)
GFR, EST AFRICAN AMERICAN: 118 mL/min/{1.73_m2} (ref >59)
Globulin, Total: 2.6 g/dL (ref 1.5–4.5)
Glucose: 85 mg/dL (ref 65–99)
Potassium: 4.6 mmol/L (ref 3.5–5.2)
Sodium: 138 mmol/L (ref 134–144)
TOTAL PROTEIN: 7.1 g/dL (ref 6.0–8.5)

## 2017-12-08 LAB — HIV ANTIBODY (ROUTINE TESTING W REFLEX): HIV Screen 4th Generation wRfx: NONREACTIVE

## 2017-12-08 LAB — TSH: TSH: 1.16 u[IU]/mL (ref 0.450–4.500)

## 2017-12-09 ENCOUNTER — Other Ambulatory Visit: Payer: Self-pay | Admitting: Medical

## 2017-12-09 LAB — CYTOLOGY - PAP
CHLAMYDIA, DNA PROBE: NEGATIVE
Diagnosis: NEGATIVE
NEISSERIA GONORRHEA: NEGATIVE

## 2017-12-09 MED ORDER — FLUCONAZOLE 150 MG PO TABS: ORAL_TABLET | ORAL | 0 refills | Status: DC

## 2017-12-28 ENCOUNTER — Telehealth: Payer: Self-pay | Admitting: Medical

## 2017-12-28 NOTE — Telephone Encounter (Signed)
Left message for pt to call, labs returned via us mail. Please verify address and resend.

## 2018-02-16 ENCOUNTER — Ambulatory Visit (INDEPENDENT_AMBULATORY_CARE_PROVIDER_SITE_OTHER): Payer: 59 | Admitting: Emergency Medicine

## 2018-02-16 ENCOUNTER — Encounter: Payer: Self-pay | Admitting: Emergency Medicine

## 2018-02-16 ENCOUNTER — Telehealth: Payer: Self-pay | Admitting: Emergency Medicine

## 2018-02-16 ENCOUNTER — Other Ambulatory Visit: Payer: Self-pay

## 2018-02-16 VITALS — BP 98/62 | HR 73 | Temp 98.5°F | Resp 16 | Ht 62.25 in | Wt 130.8 lb

## 2018-02-16 DIAGNOSIS — L299 Pruritus, unspecified: Secondary | ICD-10-CM | POA: Diagnosis not present

## 2018-02-16 DIAGNOSIS — R21 Rash and other nonspecific skin eruption: Secondary | ICD-10-CM | POA: Insufficient documentation

## 2018-02-16 MED ORDER — PREDNISONE 20 MG PO TABS: 40.0000 mg | ORAL_TABLET | Freq: Every day | ORAL | 0 refills | Status: AC

## 2018-02-16 NOTE — Patient Instructions (Addendum)
   IF you received an x-ray today, you will receive an invoice from [facility]. Please contact [facility] at [phone] with questions or concerns regarding your invoice.   IF you received labwork today, you will receive an invoice from [facility]. Please contact [facility] at [phone] with questions or concerns regarding your invoice.   Our billing staff will not be able to assist you with questions regarding bills from these companies.  You will be contacted with the lab results as soon as they are available. The fastest way to get your results is to activate your My Chart account. Instructions are located on the last page of this paperwork. If you have not heard from us regarding the results in 2 weeks, please contact this office.     Rash A rash is a change in the color of the skin. A rash can also change the way your skin feels. There are many different conditions and factors that can cause a rash. Follow these instructions at home: Pay attention to any changes in your symptoms. Follow these instructions to help with your condition: Medicine Take or apply over-the-counter and prescription medicines only as told by your doctor. These may include:  Corticosteroid cream.  Anti-itch lotions.  Oral antihistamines.  Skin Care  Put cool compresses on the affected areas.  Try taking a bath with: ? Epsom salts. Follow the instructions on the packaging. You can get these at your local pharmacy or grocery store. ? Baking soda. Pour a small amount into the bath as told by your doctor. ? Colloidal oatmeal. Follow the instructions on the packaging. You can get this at your local pharmacy or grocery store.  Try putting baking soda paste onto your skin. Stir water into baking soda until it gets like a paste.  Do not scratch or rub your skin.  Avoid covering the rash. Make sure the rash is exposed to air as much as possible. General instructions  Avoid hot  showers or baths, which can make itching worse. A cold shower may help.  Avoid scented soaps, detergents, and perfumes. Use gentle soaps, detergents, perfumes, and other cosmetic products.  Avoid anything that causes your rash. Keep a journal to help track what causes your rash. Write down: ? What you eat. ? What cosmetic products you use. ? What you drink. ? What you wear. This includes jewelry.  Keep all follow-up visits as told by your doctor. This is important. Contact a doctor if:  You sweat at night.  You lose weight.  You pee (urinate) more than normal.  You feel weak.  You throw up (vomit).  Your skin or the whites of your eyes look yellow (jaundice).  Your skin: ? Tingles. ? Is numb.  Your rash: ? Does not go away after a few days. ? Gets worse.  You are: ? More thirsty than normal. ? More tired than normal.  You have: ? New symptoms. ? Pain in your belly (abdomen). ? A fever. ? Watery poop (diarrhea). Get help right away if:  Your rash covers all or most of your body. The rash may or may not be painful.  You have blisters that: ? Are on top of the rash. ? Grow larger. ? Grow together. ? Are painful. ? Are inside your nose or mouth.  You have a rash that: ? Looks like purple pinprick-sized spots all over your body. ? Has a "bull's eye" or looks like a target. ? Is red and painful, causes   your skin to peel, and is not from being in the sun too long. This information is not intended to replace advice given to you by your health care provider. Make sure you discuss any questions you have with your health care provider. Document Released: 04/06/2008 Document Revised: 03/26/2016 Document Reviewed: 03/06/2015 Elsevier Interactive Patient Education  2018 Elsevier Inc.  

## 2018-02-16 NOTE — Progress Notes (Signed)
Alyssa Galvan 26 y.o.   Chief Complaint  Patient presents with  . Rash    x 1 week on upper body with itching    HISTORY OF PRESENT ILLNESS: [name] complaining of itchy rash x 1 week.  Recently moved to a new place and has been doing a lot of cleaning with bleach.  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  No new medications or foods.  No other significant symptoms.  Denies wheezing or difficulty breathing.  Denies swelling of the face.  Denies trouble swallowing.  HPI   Prior to Admission medications   Medication Sig Start Date End Date Taking? Authorizing Provider  fluconazole (DIFLUCAN) 150 MG tablet 1 tablet po weekly Patient not taking: Reported on 02/16/2018 12/09/17   Tysinger, Kermit Baloavid S, PA-C  valACYclovir (VALTREX) 500 MG tablet 1 tablet po BID x 3 days for out break Patient not taking: Reported on 12/07/2017 05/21/15   Tysinger, Kermit Baloavid S, PA-C    No Known Allergies  Patient Active Problem List   Diagnosis Date Noted  . Encounter for health maintenance examination in adult 12/07/2017  . Vaginal discharge 12/07/2017  . Screen for STD (sexually transmitted disease) 12/07/2017  . Ganglion cyst of dorsum of right wrist 12/07/2017  . Screening for cervical cancer 12/07/2017  . Cyst of skin 12/07/2017  . Genital herpes 05/21/2015    Past Medical History:  Diagnosis Date  . Abdominal pain 2014   ED visit, etiology constipation  . Anemia    longstanding, mild per patient  . Genital herpes 2014  . History of chlamydia 03/2013  . Seasonal allergic rhinitis     Past Surgical History:  Procedure Laterality Date  . NO PAST SURGERIES  05/2015    Social History   Socioeconomic History  . Marital status: Single    Spouse name: Not on file  . Number of children: Not on file  . Years of education: Not on file  . Highest education level: Not on file  Occupational History    . Not on file  Social Needs  . Financial resource strain: Not on file  . Food insecurity:    Worry: Not on file    Inability: Not on file  . Transportation needs:    Medical: Not on file    Non-medical: Not on file  Tobacco Use  . Smoking status: Never Smoker  . Smokeless tobacco: Never Used  Substance and Sexual Activity  . Alcohol use: No  . Drug use: No  . Sexual activity: Not Currently    Birth control/protection: None  Lifestyle  . Physical activity:    Days per week: Not on file    Minutes per session: Not on file  . Stress: Not on file  Relationships  . Social connections:    Talks on phone: Not on file    Gets together: Not on file    Attends religious service: Not on file    Active member of club or organization: Not on file    Attends meetings of clubs or organizations: Not on file    Relationship status: Not on file  . Intimate partner violence:    Fear of current or ex partner: Not on file    Emotionally abused: Not on file    Physically abused: Not on file    Forced sexual activity: Not on file  Other Topics Concern  .  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 Cancer Paternal Grandmother        breast  . Heart disease Paternal Grandfather   . Stroke Paternal Grandfather   . COPD Father   . Diabetes Neg Hx      Review of Systems  Constitutional: Negative.  Negative for chills and fever.  HENT: Negative.  Negative for sore throat.   Eyes: Negative.  Negative for blurred vision and double vision.  Respiratory: Negative.  Negative for cough and hemoptysis.   Cardiovascular: Negative.  Negative for chest pain and palpitations.  Gastrointestinal: Negative.  Negative for abdominal pain, diarrhea, nausea and vomiting.  Genitourinary:  Negative.  Negative for dysuria and hematuria.  Musculoskeletal: Negative.  Negative for myalgias and neck pain.  Skin: Positive for itching and rash.  Neurological: Negative.  Negative for dizziness and headaches.  Endo/Heme/Allergies: Negative.   All other systems reviewed and are negative.   Vitals:   02/16/18 1225  BP: 98/62  Pulse: 73  Resp: 16  Temp: 98.5 F (36.9 C)  SpO2: 99%    Physical Exam  Constitutional: She is oriented to person, place, and time. She appears well-developed and well-nourished.  HENT:  Head: Normocephalic and atraumatic.  Nose: Nose normal.  Mouth/Throat: Oropharynx is clear and moist.  Eyes: Pupils are equal, round, and reactive to light. Conjunctivae and EOM are normal.  Neck: Normal range of motion. Neck supple.  Cardiovascular: Normal rate and regular rhythm.  Pulmonary/Chest: Effort normal and breath sounds normal.  Musculoskeletal: Normal range of motion.  Lymphadenopathy:    She has no cervical adenopathy.  Neurological: She is alert and oriented to person, place, and time. No sensory deficit. She exhibits normal muscle tone.  Skin: Skin is warm and dry. Capillary refill takes less than 2 seconds.  Faint rash visible to upper back and upper extremities.  Psychiatric: She has a normal mood and affect. Her behavior is normal.  Vitals reviewed.  A total of 25 minutes was spent in the room with the patient, greater than 50% of which was in counseling/coordination of care.   ASSESSMENT & PLAN: Alyssa Galvan was seen today for rash.  Diagnoses and all orders for this visit:  Rash and nonspecific skin eruption -     predniSONE (DELTASONE) 20 MG tablet; Take 2 tablets (40 mg total) by mouth daily with breakfast for 5 days. -     RPR  Itching    Patient Instructions       IF you received an x-ray today, you will receive an invoice from [facility]. Please contact [facility] at [phone] with questions or concerns  regarding your invoice.   IF you received labwork today, you will receive an invoice from [facility]. Please contact [facility] at [phone] with questions or concerns regarding your invoice.   Our billing staff will not be able to assist you with questions regarding bills from these companies.  You will be contacted with the lab results as soon as they are available. The fastest way to get your results is to activate your My Chart account. Instructions are located on the last page of this paperwork. If you have not heard from [place] regarding the results in 2 weeks, please contact this office.  Rash A rash is a change in the color of the skin. A rash can also change the way your skin feels. There are many different conditions and factors that can cause a rash. Follow these instructions at home: Pay attention to any changes in your symptoms. Follow these instructions to help with your condition: Medicine Take or apply over-the-counter and prescription medicines only as told by your doctor. These may include:  Corticosteroid cream.  Anti-itch lotions.  Oral antihistamines.  Skin Care  Put cool compresses on the affected areas.  Try taking a bath with: ? Epsom salts. Follow the instructions on the packaging. You can get these at your local pharmacy or grocery store. ? Baking soda. Pour a small amount into the bath as told by your doctor. ? Colloidal oatmeal. Follow the instructions on the packaging. You can get this at your local pharmacy or grocery store.  Try putting baking soda paste onto your skin. Stir water into baking soda until it gets like a paste.  Do not scratch or rub your skin.  Avoid covering the rash. Make sure the rash is exposed to air as much as possible. General instructions  Avoid hot showers or baths, which can make itching worse. A cold shower may help.  Avoid scented soaps, detergents, and perfumes. Use gentle soaps, detergents, perfumes, and other cosmetic  products.  Avoid anything that causes your rash. Keep a journal to help track what causes your rash. Write down: ? What you eat. ? What cosmetic products you use. ? What you drink. ? What you wear. This includes jewelry.  Keep all follow-up visits as told by your doctor. This is important. Contact a doctor if:  You sweat at night.  You lose weight.  You pee (urinate) more than normal.  You feel weak.  You throw up (vomit).  Your skin or the whites of your eyes look yellow (jaundice).  Your skin: ? Tingles. ? Is numb.  Your rash: ? Does not go away after a few days. ? Gets worse.  You are: ? More thirsty than normal. ? More tired than normal.  You have: ? New symptoms. ? Pain in your belly (abdomen). ? A fever. ? Watery poop (diarrhea). Get help right away if:  Your rash covers all or most of your body. The rash may or may not be painful.  You have blisters that: ? Are on top of the rash. ? Grow larger. ? Grow together. ? Are painful. ? Are inside your nose or mouth.  You have a rash that: ? Looks like purple pinprick-sized spots all over your body. ? Has a "bull's eye" or looks like a target. ? Is red and painful, causes your skin to peel, and is not from being in the sun too long. This information is not intended to replace advice given to you by your health care provider. Make sure you discuss any questions you have with your health care provider. Document Released: 04/06/2008 Document Revised: 03/26/2016 Document Reviewed: 03/06/2015 Elsevier Interactive Patient Education  2018 Elsevier Inc.      Edwina Barth, MD [facility]

## 2018-02-16 NOTE — Telephone Encounter (Signed)
Patient will come by 02/17/2018 to pay $15 copay. Ok'd by Carollee HerterShannon

## 2018-02-17 ENCOUNTER — Encounter: Payer: Self-pay | Admitting: Radiology

## 2018-02-17 LAB — RPR: RPR: NONREACTIVE

## 2018-08-25 NOTE — Telephone Encounter (Signed)
DONE

## 2018-11-29 ENCOUNTER — Ambulatory Visit: Payer: 59 | Admitting: Family Medicine

## 2018-11-29 ENCOUNTER — Encounter: Payer: Self-pay | Admitting: Family Medicine

## 2018-11-29 VITALS — BP 110/76 | HR 86 | Temp 98.3°F | Wt 142.2 lb

## 2018-11-29 DIAGNOSIS — N898 Other specified noninflammatory disorders of vagina: Secondary | ICD-10-CM | POA: Diagnosis not present

## 2018-11-29 DIAGNOSIS — M25551 Pain in right hip: Secondary | ICD-10-CM

## 2018-11-29 NOTE — Progress Notes (Signed)
   Subjective:    Patient ID: Alyssa Galvan, female    DOB: 01-Nov-1991, 28 y.o.   MRN: [id number]  HPI She complains of a several month history of intermittent right hip pain.  She does have a job that requires heavy lifting but cannot necessarily relate this to the work.  She also has pains at home at different times but no particular maneuvers tend to cause this.  Presently she is not having any difficulty. She also complains of a vaginal odor that is been intermittent since May.  She describes it as sometimes having clumps and other times smelling.  Presently she is not having any symptoms.   Review of Systems     Objective:   Physical Exam Alert and in no distress.  Full motion of the hip.  No tenderness palpation over the greater trochanter or over the hip flexors. Vaginal exam shows no discharge with normal mucosa.  KOH and wet prep are negative.       Assessment & Plan:  Right hip pain  Vaginal discharge I explained that at this time no particular reason can be ascribed to her hip pain.  Recommend she come back when she is having pain for more definitive evaluation.  She will also keep track of things that make it better or make it worse. I again mentioned the fact that she would need to return when she is having the discharge for more thorough evaluation as at this point nothing seems to have shown up.

## 2018-12-16 ENCOUNTER — Encounter: Payer: Self-pay | Admitting: Medical

## 2018-12-16 ENCOUNTER — Ambulatory Visit: Payer: 59 | Admitting: Medical

## 2018-12-16 VITALS — BP 110/70 | HR 64 | Temp 97.9°F | Resp 16 | Ht 62.0 in | Wt 141.8 lb

## 2018-12-16 DIAGNOSIS — R102 Pelvic and perineal pain: Secondary | ICD-10-CM | POA: Diagnosis not present

## 2018-12-16 DIAGNOSIS — N898 Other specified noninflammatory disorders of vagina: Secondary | ICD-10-CM

## 2018-12-16 DIAGNOSIS — Z113 Encounter for screening for infections with a predominantly sexual mode of transmission: Secondary | ICD-10-CM | POA: Diagnosis not present

## 2018-12-16 LAB — POCT URINALYSIS DIP (PROADVANTAGE DEVICE)
Bilirubin, UA: NEGATIVE
Blood, UA: NEGATIVE
Glucose, UA: NEGATIVE mg/dL
Ketones, POC UA: NEGATIVE mg/dL
Leukocytes, UA: NEGATIVE
NITRITE UA: NEGATIVE
Protein Ur, POC: NEGATIVE mg/dL
Specific Gravity, Urine: 1.03
Urobilinogen, Ur: NEGATIVE
pH, UA: 5.5 (ref 5.0–8.0)

## 2018-12-16 LAB — POCT WET PREP (WET MOUNT): TRICHOMONAS WET PREP HPF POC: ABSENT

## 2018-12-16 LAB — POCT URINE PREGNANCY: PREG TEST UR: NEGATIVE

## 2018-12-16 MED ORDER — METRONIDAZOLE 500 MG PO TABS: 500.0000 mg | ORAL_TABLET | Freq: Three times a day (TID) | ORAL | 0 refills | Status: DC

## 2018-12-16 MED ORDER — FLUCONAZOLE 150 MG PO TABS: ORAL_TABLET | ORAL | 1 refills | Status: DC

## 2018-12-16 NOTE — Progress Notes (Signed)
Subjective: Chief Complaint  Patient presents with  . pelvic pain    pelvic pain X 2 months   Here for pelvic pain x 2 months.  Pain varies.  Sometimes has thicker discharge, sometimes thinner discharge, sometimes discomfort with urination.   Last week prior to period felt a heavy pelvic pain.  Periods are regular.   Last period was light.   Usually periods are thicker substance, not heavy or light.   currently not on birth control.   Not sexually active currently, last intercourse 4-5 months ago.  Sometimes has vaginal odor, sometimes not.  No urinary frequency, no urgency, but sometimes urine has strong odor, not necessarily stinky, but strong.  Has daily BM, no constipation, no bloating, not gassy.   No other aggravating or relieving factors. No other complaint.  Past Medical History:  Diagnosis Date  . Abdominal pain 2014   ED visit, etiology constipation  . Anemia    longstanding, mild per patient  . Genital herpes 2014  . History of chlamydia 03/2013  . Seasonal allergic rhinitis    Current Outpatient Medications on File Prior to Visit  Medication Sig Dispense Refill  . fluconazole (DIFLUCAN) 150 MG tablet 1 tablet po weekly (Patient not taking: Reported on 02/16/2018) 5 tablet 0  . valACYclovir (VALTREX) 500 MG tablet 1 tablet po BID x 3 days for out break (Patient not taking: Reported on 12/07/2017) 30 tablet 1   No current facility-administered medications on file prior to visit.    ROS as in subjective   Objective: BP 110/70   Pulse 64   Temp 97.9 F (36.6 C) (Oral)   Resp 16   Ht 5\' 2"  (1.575 m)   Wt 141 lb 12.8 oz (64.3 kg)   LMP 12/10/2018 (Exact Date)   SpO2 99%   BMI 25.94 kg/m   Gen: wd, wn, nad Skin unremarkable Abdomen: +bs, soft, nontender, no mass, no organomegaly Gyn: Normal external genitalia without lesions, vagina with normal mucosa, cervix without lesions, no cervical motion tenderness,+moderate creamy yellow white abnormal vaginal discharge.  Uterus  WNL, left adnexa normal, right adnexa seems to have a fullness, but no obvious mass, nontender.  Swabs taken.  Exam chaperoned by nurse. Rectal: anus normal appearing    Assessment: Encounter Diagnoses  Name Primary?  . Vaginal discharge Yes  . Pelvic pain   . Screen for STD (sexually transmitted disease)     Plan: Labs today, will treat for BV and yeast as was seen on swab, avoid high sugary foods, and call back within 2 weeks if not back to normal.  If pelvic pain continues consider ultrasound.  She voiced understanding and agreement of plan  Alyssa Galvan was seen today for pelvic pain.  Diagnoses and all orders for this visit:  Vaginal discharge -     POCT Urinalysis DIP (Proadvantage Device) -     POCT Wet Prep [facility]) -     Urine Culture -     GC/Chlamydia Probe Amp -     POCT Wet Prep [facility]) -     POCT urine pregnancy  Pelvic pain -     Urine Culture -     GC/Chlamydia Probe Amp -     POCT Wet Prep [facility]) -     POCT urine pregnancy  Screen for STD (sexually transmitted disease) -     Urine Culture -     GC/Chlamydia Probe Amp -     POCT Wet Prep [employer])  Other orders -     metroNIDAZOLE (FLAGYL) 500 MG tablet; Take 1 tablet (500 mg total) by mouth 3 (three) times daily. -     fluconazole (DIFLUCAN) 150 MG tablet; 1 tablet every other day

## 2018-12-17 LAB — URINE CULTURE

## 2018-12-20 LAB — GC/CHLAMYDIA PROBE AMP
Chlamydia trachomatis, NAA: NEGATIVE
Neisseria gonorrhoeae by PCR: NEGATIVE

## 2020-07-19 ENCOUNTER — Encounter: Payer: 59 | Admitting: Medical

## 2020-09-02 HISTORY — PX: NO PAST SURGERIES: SHX2092

## 2020-09-03 ENCOUNTER — Ambulatory Visit (INDEPENDENT_AMBULATORY_CARE_PROVIDER_SITE_OTHER): Payer: No Typology Code available for payment source | Admitting: Medical

## 2020-09-03 ENCOUNTER — Encounter: Payer: Self-pay | Admitting: Medical

## 2020-09-03 ENCOUNTER — Other Ambulatory Visit: Payer: Self-pay

## 2020-09-03 VITALS — BP 110/66 | Ht 62.0 in | Wt 136.8 lb

## 2020-09-03 DIAGNOSIS — Z13220 Encounter for screening for lipoid disorders: Secondary | ICD-10-CM

## 2020-09-03 DIAGNOSIS — Z113 Encounter for screening for infections with a predominantly sexual mode of transmission: Secondary | ICD-10-CM

## 2020-09-03 DIAGNOSIS — Z0000 Encounter for general adult medical examination without abnormal findings: Secondary | ICD-10-CM | POA: Diagnosis not present

## 2020-09-03 DIAGNOSIS — K921 Melena: Secondary | ICD-10-CM | POA: Insufficient documentation

## 2020-09-03 DIAGNOSIS — Z7185 Encounter for immunization safety counseling: Secondary | ICD-10-CM

## 2020-09-03 DIAGNOSIS — N898 Other specified noninflammatory disorders of vagina: Secondary | ICD-10-CM | POA: Diagnosis not present

## 2020-09-03 DIAGNOSIS — K068 Other specified disorders of gingiva and edentulous alveolar ridge: Secondary | ICD-10-CM

## 2020-09-03 LAB — CBC WITH DIFFERENTIAL/PLATELET
Immature Grans (Abs): 0 10*3/uL (ref 0.0–0.1)
Immature Granulocytes: 0 %
Monocytes Absolute: 0.4 10*3/uL (ref 0.1–0.9)

## 2020-09-03 LAB — COMPREHENSIVE METABOLIC PANEL

## 2020-09-03 LAB — HIV ANTIBODY (ROUTINE TESTING W REFLEX)

## 2020-09-03 LAB — LIPID PANEL

## 2020-09-03 NOTE — Progress Notes (Signed)
Subjective:   HPI  Alyssa Galvan is a 29 y.o. female who presents for Chief Complaint  Patient presents with  . Annual Exam    with fasting labs   . Vaginal Discharge    thick with pinkish with an odor, burning with urination on and off.     Patient Care Team: Daaiel Starlin, Cleda Mccreedy as PCP - General (Family Medicine) Sees dentist Sees eye doctor   Concerns: Lately gums have been bleeding.  Still has wisdom teeth.  Bleeding daily with brushing x possibly a year.  Has had some blood in stool, attributed to hemorrhoids, gets pain sometimes at anus.  No blood in urine.  Has some bruising currently.  She says she is clumsy and tends to bruise.  No family hx/o bleeding or clotting disorders.  Last cleaning with hygienist was 2 years ago.    Gets constipated.  Has BM daily. Sometimes 2 times per day.  Has been having pain often with bowel movements.  Saw some blood in stool started 05/2020.  Only sees blood on toilet paper, light amount.   bright reddish.   Has not seen a hemorrhoid until recently saw a bump that she thinks could be a hemorrhoid.  Denies a lot of gas and bloating.   Sees blood in stool 1-2 times per month.   Periods are regular and not heavy.  Having some blood in vaginal discharge.  Denies pelvic pain.  occasionally gets a sharp pelvic pain.  Sometimes gets pain with intercourse sometimes the day after intercourse, day 2 for example if 2 days in a row.  Just started having vaginal discharge 2 weeks ago, some odor.   No odor with intercourse. Same partner for a while.   Has used some other soaps recently from traveling, being in a hotel.     Gynecological history: no pregnancies.  Not currently on birth control.  Not interested in birth control currently  Urinary - sometimes burning with urination.  Has some urinary frequency since September.  Past Medical History:  Diagnosis Date  . Abdominal pain 2014   ED visit, etiology constipation  . Anemia    longstanding, mild  per patient  . Genital herpes 2014  . History of chlamydia 03/2013  . Seasonal allergic rhinitis     Family History  Problem Relation Age of Onset  . Cancer Paternal Grandmother        breast  . Heart disease Paternal Grandfather        MI  . Stroke Paternal Grandfather   . COPD Father   . Diabetes Neg Hx      Current Outpatient Medications:  .  valACYclovir (VALTREX) 500 MG tablet, 1 tablet po BID x 3 days for out break (Patient not taking: Reported on 12/07/2017), Disp: 30 tablet, Rfl: 1  No Known Allergies    Reviewed their medical, surgical, family, social, medication, and allergy history and updated chart as appropriate.    Review of Systems Constitutional: -fever, -chills, -sweats, -unexpected weight change, -decreased appetite, -fatigue Allergy: -sneezing, -itching, -congestion Dermatology: -changing moles, --rash, -lumps ENT: -runny nose, -ear pain, -sore throat, -hoarseness, -sinus pain, -teeth pain, - ringing in ears, -hearing loss, -nosebleeds Cardiology: -chest pain, -palpitations, -swelling, -difficulty breathing when lying flat, -waking up short of breath Respiratory: -cough, -shortness of breath, -difficulty breathing with exercise or exertion, -wheezing, -coughing up blood Gastroenterology: -abdominal pain, -nausea, -vomiting, -diarrhea, -constipation, -blood in stool, -changes in bowel movement, -difficulty swallowing or eating Hematology: -bleeding, -bruising  Musculoskeletal: -joint aches, -muscle aches, -joint swelling, -back pain, -neck pain, -cramping, -changes in gait Ophthalmology: denies vision changes, eye redness, itching, discharge Urology: -burning with urination, -difficulty urinating, -blood in urine, -urinary frequency, -urgency, -incontinence Neurology: -headache, -weakness, -tingling, -numbness, -memory loss, -falls, -dizziness Psychology: -depressed mood, -agitation, -sleep problems Breast/gyn: -breast tendnerss, -discharge, -lumps, -vaginal  discharge,- irregular periods, -heavy periods     Objective:  BP 110/66   Ht 5\' 2"  (1.575 m)   Wt 136 lb 12.8 oz (62.1 kg)   BMI 25.02 kg/m   General appearance: alert, no distress, WD/WN, African American female Skin: unremarkable HEENT: normocephalic, conjunctiva/corneas normal, sclerae anicteric, PERRLA, EOMi, nares patent, no discharge or erythema, pharynx normal Oral cavity: MMM, tongue normal, teeth normal Neck: supple, no lymphadenopathy, no thyromegaly, no masses, normal ROM, no bruits Chest: non tender, normal shape and expansion Heart: RRR, normal S1, S2, no murmurs Lungs: CTA bilaterally, no wheezes, rhonchi, or rales Abdomen: +bs, soft, non tender, non distended, no masses, no hepatomegaly, no splenomegaly, no bruits Back: non tender, normal ROM, no scoliosis Musculoskeletal: upper extremities non tender, no obvious deformity, normal ROM throughout, lower extremities non tender, no obvious deformity, normal ROM throughout Extremities: no edema, no cyanosis, no clubbing Pulses: 2+ symmetric, upper and lower extremities, normal cap refill Neurological: alert, oriented x 3, CN2-12 intact, strength normal upper extremities and lower extremities, sensation normal throughout, DTRs 2+ throughout, no cerebellar signs, gait normal Psychiatric: normal affect, behavior normal, pleasant  Breast: nontender, no masses or lumps, no skin changes, no nipple discharge or inversion, no axillary lymphadenopathy Gyn: Normal external genitalia without lesions, vagina with normal mucosa, cervix without lesions, no cervical motion tenderness, whitish vaginal discharge.  Uterus and adnexa not enlarged, nontender, no masses.   Exam chaperoned by nurse. Rectal: Anus normal-appearing, no obvious fissure or hemorrhoid externally   Assessment and Plan :   Encounter Diagnoses  Name Primary?  . Encounter for health maintenance examination in adult Yes  . Vaginal discharge   . Vaccine counseling   .  Screening for lipid disorders   . Screen for STD (sexually transmitted disease)   . Blood in stool   . Bleeding gums     Physical exam - discussed and counseled on healthy lifestyle, diet, exercise, preventative care, vaccinations, sick and well care, proper use of emergency dept and after hours care, and addressed their concerns.    Health screening: Advised they see their eye doctor yearly for routine vision care. Advised they see their dentist yearly for routine dental care including hygiene visits twice yearly.  Discussed STD testing, discussed prevention, condom use, means of transmission  Cancer screening Counseled on self breast exams, mammograms, cervical cancer screening Pap up-to-date in 2019   Vaccinations: Advised yearly influenza vaccine She will check her vaccine records for tetanus and HPV.  If these have not been updated, she will return for these.  She declines flu and Covid vaccine   Separate significant issues discussed: Blood in stool -discussed possible causes.  Exam unremarkable.  I suspect fissure or hemorrhoid.  Nevertheless will likely need gastroenterology consult particularly if anemic on labs  Bleeding gums-discussed possible causes, follow-up pending labs.  Teeth in good repair, no oral lesions of concern  Vaginal discharge-wet prep swab today unremarkable.  Follow-up pending additional labs  Dysuria-follow-up pending labs   [place] was seen today for annual exam and vaginal discharge.  Diagnoses and all orders for this visit:  Encounter for health maintenance examination in adult -  Comprehensive metabolic panel -     CBC with Differential/Platelet -     HIV Antibody (routine testing w rflx) -     RPR -     GC/Chlamydia Probe Amp -     Hepatitis C antibody -     Hepatitis B surface antigen -     Lipid panel -     TSH -     POCT Wet Prep ([facility]) -     POCT Urinalysis DIP (Proadvantage Device) -     POCT urine pregnancy  Vaginal  discharge -     POCT Wet Prep [facility])  Vaccine counseling  Screening for lipid disorders  Screen for STD (sexually transmitted disease) -     HIV Antibody (routine testing w rflx) -     RPR -     GC/Chlamydia Probe Amp -     Hepatitis C antibody -     Hepatitis B surface antigen -     POCT Wet Prep ([facility] [place])  Blood in stool  Bleeding gums    Follow-up pending labs, yearly for physical

## 2020-09-04 ENCOUNTER — Other Ambulatory Visit: Payer: Self-pay | Admitting: Medical

## 2020-09-04 DIAGNOSIS — K921 Melena: Secondary | ICD-10-CM

## 2020-09-04 DIAGNOSIS — Z0000 Encounter for general adult medical examination without abnormal findings: Secondary | ICD-10-CM

## 2020-09-04 DIAGNOSIS — K068 Other specified disorders of gingiva and edentulous alveolar ridge: Secondary | ICD-10-CM

## 2020-09-04 LAB — CBC WITH DIFFERENTIAL/PLATELET
Basophils Absolute: 0 10*3/uL (ref 0.0–0.2)
Basos: 1 %
EOS (ABSOLUTE): 0 10*3/uL (ref 0.0–0.4)
Eos: 1 %
Hematocrit: 38.5 % (ref 34.0–46.6)
Hemoglobin: 12.1 g/dL (ref 11.1–15.9)
Lymphocytes Absolute: 1.6 10*3/uL (ref 0.7–3.1)
Lymphs: 24 %
MCH: 22.9 pg — ABNORMAL LOW (ref 26.6–33.0)
MCHC: 31.4 g/dL — ABNORMAL LOW (ref 31.5–35.7)
MCV: 73 fL — ABNORMAL LOW (ref 79–97)
Monocytes: 7 %
Neutrophils Absolute: 4.6 10*3/uL (ref 1.4–7.0)
Neutrophils: 67 %
Platelets: 233 10*3/uL (ref 150–450)
RBC: 5.28 x10E6/uL (ref 3.77–5.28)
RDW: 15.4 % (ref 11.7–15.4)
WBC: 6.7 10*3/uL (ref 3.4–10.8)

## 2020-09-04 LAB — COMPREHENSIVE METABOLIC PANEL
ALT: 13 IU/L (ref 0–32)
AST: 20 IU/L (ref 0–40)
Albumin/Globulin Ratio: 1.7 (ref 1.2–2.2)
Albumin: 4.5 g/dL (ref 3.9–5.0)
BUN/Creatinine Ratio: 12 (ref 9–23)
Bilirubin Total: 0.3 mg/dL (ref 0.0–1.2)
CO2: 22 mmol/L (ref 20–29)
Creatinine, Ser: 0.94 mg/dL (ref 0.57–1.00)
GFR calc non Af Amer: 83 mL/min/{1.73_m2} (ref >59)
Globulin, Total: 2.7 g/dL (ref 1.5–4.5)
Glucose: 83 mg/dL (ref 65–99)
Potassium: 4.3 mmol/L (ref 3.5–5.2)
Sodium: 135 mmol/L (ref 134–144)
Total Protein: 7.2 g/dL (ref 6.0–8.5)

## 2020-09-04 LAB — POCT WET PREP (WET MOUNT): Trichomonas Wet Prep HPF POC: ABSENT

## 2020-09-04 LAB — LIPID PANEL
Cholesterol, Total: 222 mg/dL — ABNORMAL HIGH (ref 100–199)
HDL: 93 mg/dL (ref >39)
LDL Chol Calc (NIH): 122 mg/dL — ABNORMAL HIGH (ref 0–99)
Triglycerides: 39 mg/dL (ref 0–149)
VLDL Cholesterol Cal: 7 mg/dL (ref 5–40)

## 2020-09-04 LAB — TSH: TSH: 0.978 u[IU]/mL (ref 0.450–4.500)

## 2020-09-04 LAB — HEPATITIS C ANTIBODY: Hep C Virus Ab: <0.1 s/co ratio (ref 0.0–0.9)

## 2020-09-04 LAB — HEPATITIS B SURFACE ANTIGEN: Hepatitis B Surface Ag: NEGATIVE

## 2020-09-04 LAB — RPR: RPR Ser Ql: NONREACTIVE

## 2020-09-04 MED ORDER — HYDROCORTISONE ACETATE 25 MG RE SUPP: 25.0000 mg | Freq: Two times a day (BID) | RECTAL | 0 refills | Status: AC

## 2020-09-04 MED ORDER — HYDROCORTISONE 2.5 % EX CREA: TOPICAL_CREAM | Freq: Two times a day (BID) | CUTANEOUS | 1 refills | Status: AC

## 2020-09-06 ENCOUNTER — Other Ambulatory Visit: Payer: Self-pay | Admitting: Medical

## 2020-09-06 ENCOUNTER — Other Ambulatory Visit: Payer: Self-pay

## 2020-09-06 ENCOUNTER — Telehealth: Payer: Self-pay

## 2020-09-06 ENCOUNTER — Other Ambulatory Visit (INDEPENDENT_AMBULATORY_CARE_PROVIDER_SITE_OTHER): Payer: No Typology Code available for payment source

## 2020-09-06 DIAGNOSIS — R102 Pelvic and perineal pain: Secondary | ICD-10-CM | POA: Diagnosis not present

## 2020-09-06 LAB — POCT URINALYSIS DIP (PROADVANTAGE DEVICE)
Bilirubin, UA: NEGATIVE
Blood, UA: NEGATIVE
Glucose, UA: NEGATIVE mg/dL
Ketones, POC UA: NEGATIVE mg/dL
Leukocytes, UA: NEGATIVE
Nitrite, UA: NEGATIVE
Protein Ur, POC: NEGATIVE mg/dL
Specific Gravity, Urine: 1.03
Urobilinogen, Ur: 0.2
pH, UA: 6 (ref 5.0–8.0)

## 2020-09-06 LAB — GC/CHLAMYDIA PROBE AMP
Chlamydia trachomatis, NAA: NEGATIVE
Neisseria Gonorrhoeae by PCR: NEGATIVE

## 2020-09-06 LAB — POCT URINE PREGNANCY: Preg Test, Ur: NEGATIVE

## 2020-09-06 MED ORDER — FLUCONAZOLE 150 MG PO TABS: 150.0000 mg | ORAL_TABLET | Freq: Once | ORAL | 0 refills | Status: AC

## 2020-09-06 MED ORDER — METRONIDAZOLE 500 MG PO TABS: ORAL_TABLET | ORAL | 1 refills | Status: AC

## 2020-09-06 NOTE — Telephone Encounter (Signed)
Tried to reach pt to give Shane's message but had to leave message

## 2020-09-10 NOTE — Telephone Encounter (Signed)
Genera spoke with pt

## 2021-07-15 ENCOUNTER — Encounter: Payer: Self-pay | Admitting: Internal Medicine
